# Patient Record
Sex: Male | Born: 1945 | Race: Black or African American | Hispanic: No | Marital: Married | Smoking: Never smoker
Health system: Southern US, Community
[De-identification: ages and names within clinical notes are randomized; demographics above are authoritative.]

## PROBLEM LIST (undated history)

## (undated) DIAGNOSIS — G2582 Stiff-man syndrome: Secondary | ICD-10-CM

## (undated) DIAGNOSIS — Z86718 Personal history of other venous thrombosis and embolism: Secondary | ICD-10-CM

## (undated) DIAGNOSIS — I10 Essential (primary) hypertension: Secondary | ICD-10-CM

## (undated) DIAGNOSIS — C61 Malignant neoplasm of prostate: Secondary | ICD-10-CM

## (undated) DIAGNOSIS — D869 Sarcoidosis, unspecified: Secondary | ICD-10-CM

## (undated) DIAGNOSIS — J45909 Unspecified asthma, uncomplicated: Secondary | ICD-10-CM

## (undated) DIAGNOSIS — N189 Chronic kidney disease, unspecified: Secondary | ICD-10-CM

## (undated) HISTORY — PX: OTHER SURGICAL HISTORY: SHX169

## (undated) HISTORY — PX: TUMOR REMOVAL: SHX12

## (undated) HISTORY — PX: SHOULDER SURGERY: SHX246

## (undated) HISTORY — PX: ROBOT ASSISTED LAPAROSCOPIC RADICAL PROSTATECTOMY: SHX5141

## (undated) HISTORY — PX: CARDIAC SURGERY: SHX584

---

## 2011-11-10 DIAGNOSIS — R7303 Prediabetes: Secondary | ICD-10-CM | POA: Insufficient documentation

## 2016-10-19 DIAGNOSIS — Z86711 Personal history of pulmonary embolism: Secondary | ICD-10-CM | POA: Insufficient documentation

## 2017-09-21 DIAGNOSIS — Z86718 Personal history of other venous thrombosis and embolism: Secondary | ICD-10-CM | POA: Insufficient documentation

## 2018-02-09 DIAGNOSIS — I890 Lymphedema, not elsewhere classified: Secondary | ICD-10-CM

## 2018-02-25 DIAGNOSIS — C61 Malignant neoplasm of prostate: Secondary | ICD-10-CM | POA: Insufficient documentation

## 2019-04-07 DIAGNOSIS — I10 Essential (primary) hypertension: Secondary | ICD-10-CM | POA: Diagnosis present

## 2019-04-19 DIAGNOSIS — C4921 Malignant neoplasm of connective and soft tissue of right lower limb, including hip: Secondary | ICD-10-CM | POA: Insufficient documentation

## 2019-04-19 DIAGNOSIS — D6859 Other primary thrombophilia: Secondary | ICD-10-CM | POA: Insufficient documentation

## 2019-04-19 DIAGNOSIS — R252 Cramp and spasm: Secondary | ICD-10-CM | POA: Insufficient documentation

## 2019-04-19 DIAGNOSIS — I82502 Chronic embolism and thrombosis of unspecified deep veins of left lower extremity: Secondary | ICD-10-CM | POA: Insufficient documentation

## 2019-04-19 DIAGNOSIS — M79661 Pain in right lower leg: Secondary | ICD-10-CM | POA: Insufficient documentation

## 2019-04-19 DIAGNOSIS — I87309 Chronic venous hypertension (idiopathic) without complications of unspecified lower extremity: Secondary | ICD-10-CM | POA: Insufficient documentation

## 2019-04-19 DIAGNOSIS — I639 Cerebral infarction, unspecified: Secondary | ICD-10-CM | POA: Insufficient documentation

## 2019-04-19 DIAGNOSIS — D869 Sarcoidosis, unspecified: Secondary | ICD-10-CM | POA: Insufficient documentation

## 2019-04-28 DIAGNOSIS — I340 Nonrheumatic mitral (valve) insufficiency: Secondary | ICD-10-CM | POA: Insufficient documentation

## 2019-04-28 DIAGNOSIS — I351 Nonrheumatic aortic (valve) insufficiency: Secondary | ICD-10-CM | POA: Insufficient documentation

## 2020-06-05 DIAGNOSIS — Z8616 Personal history of COVID-19: Secondary | ICD-10-CM | POA: Insufficient documentation

## 2020-06-05 DIAGNOSIS — N049 Nephrotic syndrome with unspecified morphologic changes: Secondary | ICD-10-CM | POA: Insufficient documentation

## 2020-06-05 DIAGNOSIS — I129 Hypertensive chronic kidney disease with stage 1 through stage 4 chronic kidney disease, or unspecified chronic kidney disease: Secondary | ICD-10-CM | POA: Insufficient documentation

## 2020-06-05 DIAGNOSIS — C419 Malignant neoplasm of bone and articular cartilage, unspecified: Secondary | ICD-10-CM | POA: Insufficient documentation

## 2020-06-05 DIAGNOSIS — R809 Proteinuria, unspecified: Secondary | ICD-10-CM | POA: Insufficient documentation

## 2021-02-25 DIAGNOSIS — N401 Enlarged prostate with lower urinary tract symptoms: Secondary | ICD-10-CM | POA: Insufficient documentation

## 2021-02-25 DIAGNOSIS — N529 Male erectile dysfunction, unspecified: Secondary | ICD-10-CM | POA: Insufficient documentation

## 2021-05-21 DIAGNOSIS — N184 Chronic kidney disease, stage 4 (severe): Secondary | ICD-10-CM | POA: Insufficient documentation

## 2021-05-21 DIAGNOSIS — G2582 Stiff-man syndrome: Secondary | ICD-10-CM

## 2021-06-08 DIAGNOSIS — I6930 Unspecified sequelae of cerebral infarction: Secondary | ICD-10-CM | POA: Insufficient documentation

## 2021-06-08 DIAGNOSIS — G4733 Obstructive sleep apnea (adult) (pediatric): Secondary | ICD-10-CM | POA: Insufficient documentation

## 2021-06-08 DIAGNOSIS — C4921 Malignant neoplasm of connective and soft tissue of right lower limb, including hip: Secondary | ICD-10-CM | POA: Insufficient documentation

## 2021-06-08 DIAGNOSIS — Z7901 Long term (current) use of anticoagulants: Secondary | ICD-10-CM

## 2021-06-08 DIAGNOSIS — J45909 Unspecified asthma, uncomplicated: Secondary | ICD-10-CM | POA: Insufficient documentation

## 2021-06-08 DIAGNOSIS — Z95828 Presence of other vascular implants and grafts: Secondary | ICD-10-CM | POA: Insufficient documentation

## 2021-06-08 DIAGNOSIS — I480 Paroxysmal atrial fibrillation: Secondary | ICD-10-CM | POA: Diagnosis present

## 2021-06-18 DIAGNOSIS — M12811 Other specific arthropathies, not elsewhere classified, right shoulder: Secondary | ICD-10-CM | POA: Insufficient documentation

## 2021-07-21 ENCOUNTER — Emergency Department (HOSPITAL_COMMUNITY): Payer: Medicare Other

## 2021-07-21 ENCOUNTER — Encounter (HOSPITAL_COMMUNITY): Payer: Self-pay | Admitting: Emergency Medicine

## 2021-07-21 ENCOUNTER — Inpatient Hospital Stay (HOSPITAL_COMMUNITY)
Admission: EM | Admit: 2021-07-21 | Discharge: 2021-07-25 | DRG: 683 | Disposition: A | Discharge status: Home / Self Care | Payer: Medicare Other | Attending: Family Medicine | Admitting: Family Medicine

## 2021-07-21 ENCOUNTER — Other Ambulatory Visit: Payer: Self-pay

## 2021-07-21 DIAGNOSIS — Z23 Encounter for immunization: Secondary | ICD-10-CM

## 2021-07-21 DIAGNOSIS — G2582 Stiff-man syndrome: Secondary | ICD-10-CM

## 2021-07-21 DIAGNOSIS — Z888 Allergy status to other drugs, medicaments and biological substances status: Secondary | ICD-10-CM

## 2021-07-21 DIAGNOSIS — I129 Hypertensive chronic kidney disease with stage 1 through stage 4 chronic kidney disease, or unspecified chronic kidney disease: Secondary | ICD-10-CM | POA: Diagnosis present

## 2021-07-21 DIAGNOSIS — K5903 Drug induced constipation: Secondary | ICD-10-CM | POA: Diagnosis present

## 2021-07-21 DIAGNOSIS — I890 Lymphedema, not elsewhere classified: Secondary | ICD-10-CM

## 2021-07-21 DIAGNOSIS — M5136 Other intervertebral disc degeneration, lumbar region: Secondary | ICD-10-CM | POA: Diagnosis present

## 2021-07-21 DIAGNOSIS — Z7902 Long term (current) use of antithrombotics/antiplatelets: Secondary | ICD-10-CM

## 2021-07-21 DIAGNOSIS — D6859 Other primary thrombophilia: Secondary | ICD-10-CM | POA: Diagnosis present

## 2021-07-21 DIAGNOSIS — Z8546 Personal history of malignant neoplasm of prostate: Secondary | ICD-10-CM

## 2021-07-21 DIAGNOSIS — R296 Repeated falls: Secondary | ICD-10-CM | POA: Diagnosis present

## 2021-07-21 DIAGNOSIS — K648 Other hemorrhoids: Secondary | ICD-10-CM | POA: Diagnosis present

## 2021-07-21 DIAGNOSIS — Z9079 Acquired absence of other genital organ(s): Secondary | ICD-10-CM

## 2021-07-21 DIAGNOSIS — I10 Essential (primary) hypertension: Secondary | ICD-10-CM | POA: Diagnosis present

## 2021-07-21 DIAGNOSIS — Z7901 Long term (current) use of anticoagulants: Secondary | ICD-10-CM

## 2021-07-21 DIAGNOSIS — M12811 Other specific arthropathies, not elsewhere classified, right shoulder: Secondary | ICD-10-CM

## 2021-07-21 DIAGNOSIS — R262 Difficulty in walking, not elsewhere classified: Secondary | ICD-10-CM

## 2021-07-21 DIAGNOSIS — Y92009 Unspecified place in unspecified non-institutional (private) residence as the place of occurrence of the external cause: Secondary | ICD-10-CM | POA: Diagnosis not present

## 2021-07-21 DIAGNOSIS — R7989 Other specified abnormal findings of blood chemistry: Secondary | ICD-10-CM | POA: Diagnosis not present

## 2021-07-21 DIAGNOSIS — Z86718 Personal history of other venous thrombosis and embolism: Secondary | ICD-10-CM

## 2021-07-21 DIAGNOSIS — N3941 Urge incontinence: Secondary | ICD-10-CM | POA: Diagnosis present

## 2021-07-21 DIAGNOSIS — D649 Anemia, unspecified: Secondary | ICD-10-CM | POA: Diagnosis present

## 2021-07-21 DIAGNOSIS — Z85830 Personal history of malignant neoplasm of bone: Secondary | ICD-10-CM | POA: Diagnosis not present

## 2021-07-21 DIAGNOSIS — Z20822 Contact with and (suspected) exposure to covid-19: Secondary | ICD-10-CM | POA: Diagnosis present

## 2021-07-21 DIAGNOSIS — W010XXA Fall on same level from slipping, tripping and stumbling without subsequent striking against object, initial encounter: Secondary | ICD-10-CM | POA: Diagnosis present

## 2021-07-21 DIAGNOSIS — Z79899 Other long term (current) drug therapy: Secondary | ICD-10-CM | POA: Diagnosis not present

## 2021-07-21 DIAGNOSIS — Z86711 Personal history of pulmonary embolism: Secondary | ICD-10-CM | POA: Diagnosis not present

## 2021-07-21 DIAGNOSIS — I480 Paroxysmal atrial fibrillation: Secondary | ICD-10-CM | POA: Diagnosis present

## 2021-07-21 DIAGNOSIS — N184 Chronic kidney disease, stage 4 (severe): Secondary | ICD-10-CM

## 2021-07-21 DIAGNOSIS — E875 Hyperkalemia: Secondary | ICD-10-CM

## 2021-07-21 DIAGNOSIS — T40605A Adverse effect of unspecified narcotics, initial encounter: Secondary | ICD-10-CM | POA: Diagnosis present

## 2021-07-21 DIAGNOSIS — R531 Weakness: Secondary | ICD-10-CM

## 2021-07-21 DIAGNOSIS — N179 Acute kidney failure, unspecified: Principal | ICD-10-CM | POA: Diagnosis present

## 2021-07-21 DIAGNOSIS — K5289 Other specified noninfective gastroenteritis and colitis: Secondary | ICD-10-CM | POA: Diagnosis present

## 2021-07-21 DIAGNOSIS — K625 Hemorrhage of anus and rectum: Secondary | ICD-10-CM | POA: Diagnosis not present

## 2021-07-21 HISTORY — DX: Essential (primary) hypertension: I10

## 2021-07-21 HISTORY — DX: Malignant neoplasm of prostate: C61

## 2021-07-21 HISTORY — DX: Personal history of other venous thrombosis and embolism: Z86.718

## 2021-07-21 HISTORY — DX: Sarcoidosis, unspecified: D86.9

## 2021-07-21 HISTORY — DX: Chronic kidney disease, unspecified: N18.9

## 2021-07-21 HISTORY — DX: Stiff-man syndrome: G25.82

## 2021-07-21 HISTORY — DX: Unspecified asthma, uncomplicated: J45.909

## 2021-07-21 LAB — CBC WITH DIFFERENTIAL/PLATELET
Abs Immature Granulocytes: 0.02 10*3/uL (ref 0.00–0.07)
Basophils Absolute: 0 10*3/uL (ref 0.0–0.1)
Basophils Relative: 0 %
Eosinophils Absolute: 0.2 10*3/uL (ref 0.0–0.5)
Eosinophils Relative: 3 %
HCT: 32.1 % — ABNORMAL LOW (ref 39.0–52.0)
Hemoglobin: 10 g/dL — ABNORMAL LOW (ref 13.0–17.0)
Immature Granulocytes: 0 %
Lymphocytes Relative: 13 %
Lymphs Abs: 0.9 10*3/uL (ref 0.7–4.0)
MCH: 30.3 pg (ref 26.0–34.0)
MCHC: 31.2 g/dL (ref 30.0–36.0)
MCV: 97.3 fL (ref 80.0–100.0)
Monocytes Absolute: 0.6 10*3/uL (ref 0.1–1.0)
Monocytes Relative: 9 %
Neutro Abs: 5.2 10*3/uL (ref 1.7–7.7)
Neutrophils Relative %: 75 %
Platelets: 151 10*3/uL (ref 150–400)
RBC: 3.3 MIL/uL — ABNORMAL LOW (ref 4.22–5.81)
RDW: 14.9 % (ref 11.5–15.5)
WBC: 6.9 10*3/uL (ref 4.0–10.5)
nRBC: 0 % (ref 0.0–0.2)

## 2021-07-21 LAB — URINALYSIS, ROUTINE W REFLEX MICROSCOPIC
Bilirubin Urine: NEGATIVE
Glucose, UA: NEGATIVE mg/dL
Ketones, ur: NEGATIVE mg/dL
Leukocytes,Ua: NEGATIVE
Nitrite: NEGATIVE
Protein, ur: 100 mg/dL — AB
Specific Gravity, Urine: 1.014 (ref 1.005–1.030)
pH: 5 (ref 5.0–8.0)

## 2021-07-21 LAB — CBG MONITORING, ED: Glucose-Capillary: 88 mg/dL (ref 70–99)

## 2021-07-21 LAB — COMPREHENSIVE METABOLIC PANEL
ALT: 34 U/L (ref 0–44)
AST: 77 U/L — ABNORMAL HIGH (ref 15–41)
Albumin: 2.9 g/dL — ABNORMAL LOW (ref 3.5–5.0)
Alkaline Phosphatase: 180 U/L — ABNORMAL HIGH (ref 38–126)
Anion gap: 6 (ref 5–15)
BUN: 63 mg/dL — ABNORMAL HIGH (ref 8–23)
CO2: 21 mmol/L — ABNORMAL LOW (ref 22–32)
Calcium: 9.3 mg/dL (ref 8.9–10.3)
Chloride: 109 mmol/L (ref 98–111)
Creatinine, Ser: 4.78 mg/dL — ABNORMAL HIGH (ref 0.61–1.24)
GFR, Estimated: 12 mL/min — ABNORMAL LOW (ref >60)
Glucose, Bld: 103 mg/dL — ABNORMAL HIGH (ref 70–99)
Potassium: 5.4 mmol/L — ABNORMAL HIGH (ref 3.5–5.1)
Sodium: 136 mmol/L (ref 135–145)
Total Bilirubin: 0.8 mg/dL (ref 0.3–1.2)
Total Protein: 7.3 g/dL (ref 6.5–8.1)

## 2021-07-21 LAB — CK: Total CK: 745 U/L — ABNORMAL HIGH (ref 49–397)

## 2021-07-21 MED ORDER — OXYCODONE HCL 5 MG PO TABS: 5.0000 mg | ORAL_TABLET | ORAL | Status: DC | PRN

## 2021-07-21 MED ORDER — ACETAMINOPHEN 650 MG RE SUPP: 650.0000 mg | Freq: Four times a day (QID) | RECTAL | Status: DC | PRN

## 2021-07-21 MED ORDER — MIRABEGRON ER 25 MG PO TB24
50.0000 mg | ORAL_TABLET | Freq: Every day | ORAL | Status: DC
  Administered 2021-07-22 – 2021-07-25 (×3): 50 mg via ORAL
  Filled 2021-07-21 (×3): qty 2

## 2021-07-21 MED ORDER — ONDANSETRON HCL 4 MG PO TABS: 4.0000 mg | ORAL_TABLET | Freq: Four times a day (QID) | ORAL | Status: DC | PRN

## 2021-07-21 MED ORDER — ONDANSETRON HCL 4 MG/2ML IJ SOLN: 4.0000 mg | Freq: Four times a day (QID) | INTRAMUSCULAR | Status: DC | PRN

## 2021-07-21 MED ORDER — MELATONIN 5 MG PO TABS
10.0000 mg | ORAL_TABLET | Freq: Every evening | ORAL | Status: DC | PRN
  Filled 2021-07-21: qty 2

## 2021-07-21 MED ORDER — HEPARIN (PORCINE) 25000 UT/250ML-% IV SOLN
1050.0000 [IU]/h | INTRAVENOUS | Status: DC
  Administered 2021-07-21: 1050 [IU]/h via INTRAVENOUS
  Filled 2021-07-21: qty 250

## 2021-07-21 MED ORDER — MELATONIN 3 MG PO TABS: 10.0000 mg | ORAL_TABLET | Freq: Every evening | ORAL | Status: DC | PRN

## 2021-07-21 MED ORDER — SODIUM CHLORIDE 0.9 % IV BOLUS
500.0000 mL | Freq: Once | INTRAVENOUS | Status: AC
  Administered 2021-07-21: 500 mL via INTRAVENOUS

## 2021-07-21 MED ORDER — SODIUM CHLORIDE 0.9 % IV SOLN: INTRAVENOUS | Status: DC

## 2021-07-21 MED ORDER — AMLODIPINE BESYLATE 5 MG PO TABS
5.0000 mg | ORAL_TABLET | Freq: Every day | ORAL | Status: DC
  Administered 2021-07-22 – 2021-07-25 (×3): 5 mg via ORAL
  Filled 2021-07-21 (×3): qty 1

## 2021-07-21 MED ORDER — ACETAMINOPHEN 325 MG PO TABS: 650.0000 mg | ORAL_TABLET | Freq: Four times a day (QID) | ORAL | Status: DC | PRN

## 2021-07-21 MED ORDER — SODIUM ZIRCONIUM CYCLOSILICATE 5 G PO PACK
10.0000 g | PACK | Freq: Once | ORAL | Status: AC
  Administered 2021-07-21: 10 g via ORAL
  Filled 2021-07-21: qty 2

## 2021-07-21 NOTE — Subjective & Objective (Signed)
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  Patient had right rotator cuff surgery performed at [facility] hospital 2 weeks ago.  Patient was discharged on Lyrica, Zanaflex and oxycodone.  Since that time, the patient has been having increasing ability was stumbling and falling.  Patient fell 3 days ago while walking up the front of his stairs to his home.  Likely his wife was behind him and he fell on top of her.  Patient has known CKD stage IV with a baseline creatinine approximately 2.8-3.2.  He has been followed extensively at [facility].  Patient recently moved to [facility] from [place].  He is in the ER with his wife [name].  [name] states that she has been having increasing difficulty getting the patient up and about.  He was normally walking and driving without any assistive devices.  Over the last 3 days, since his fall, she has had to help him get out of bed.  She even had a call EMS when he fell in the house as she was unable to pick them up.  On arrival to the ER today, blood pressure 101/62, heart rate 73, temperature 98.1.  Labs show white count 6.9, hemoglobin 10.0, sodium 136, potassium 5.4, BUN of 63, creatinine 4.78, CO2 of 21, glucose 103.  Urinalysis specific gravity 1.014  CT head without acute intracranial abnormality.  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  Patient states that he has been drinking fluids well over the last week.  Wife states he will drink for 5 bottles of water along with a bottle of soda and it may be the bottle of  Gatorade as well.  Appetites been normal.  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  Due to the patient's acute renal failure and inability to walk, [facility] hospital[facility] contacted for admission.

## 2021-07-21 NOTE — ED Notes (Signed)
Bladder scan read 104 ML

## 2021-07-21 NOTE — ED Triage Notes (Addendum)
Pt arrives to ED d/t weakness post shoulder surgery 9/29. Pt concerned d/t having trouble getting around. Pts wife states periods of AMS, pt A&Ox4 during triage.

## 2021-07-21 NOTE — H&P (Signed)
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  Patient had right rotator cuff surgery performed at [facility] hospital 2 weeks ago.  Patient was discharged on Lyrica, Zanaflex and oxycodone.  Since that time, the patient has been having increasing ability was stumbling and falling.  Patient fell 3 days ago while walking up the front of his stairs to his home.  Likely his wife was behind him and he fell on top of her.  Patient has known CKD stage IV with a baseline creatinine approximately 2.8-3.2.  He has been followed extensively at [facility].  Patient recently moved to [facility] from [place].  He is in the ER with his wife Carlos Rodriguez.  Carlos Rodriguez states that she has been having increasing difficulty getting the patient up and about.  He was normally walking and driving without any assistive devices.  Over the last 3 days, since his fall, she has had to help him get out of bed.  She even had a call EMS when he fell in the house as she was unable to pick them up.  On arrival to the ER today, blood pressure 101/62, heart rate 73, temperature 98.1.  Labs show white count 6.9, hemoglobin 10.0, sodium 136, potassium 5.4, BUN of 63, creatinine 4.78, CO2 of 21, glucose 103.  Urinalysis specific gravity 1.014  CT head without acute intracranial abnormality.  Lumbar spine series demonstrated advanced degenerative disc disease L3-4, L4-5,  L5-S1  Right hip x-ray negative for fracture dislocation.  Patient states that he has been drinking fluids well over the last week.  Wife states he will drink for 5 bottles of water along with a bottle of soda and it may be the bottle of Gatorade as well.  Appetites been normal.  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  Due to the patient's acute renal failure and inability to walk, [facility] contacted for admission.   ED Course: pt noted to have AKI on CKD stage 4. Started on IVF.  Review of Systems:  Review of Systems  Constitutional:  Positive for malaise/fatigue.  HENT: Negative.    Eyes: Negative.   Respiratory: Negative.    Cardiovascular: Negative.   Gastrointestinal: Negative.   Genitourinary:        Chronic urinary incontinence  Musculoskeletal:        Difficulty walking  Skin: Negative.   Neurological:  Positive for dizziness and weakness.  Endo/Heme/Allergies: Negative.   Psychiatric/Behavioral: Negative.    All other systems reviewed and are negative.  Past Medical History:  Diagnosis Date   Asthma     Past Surgical History:  Procedure Laterality Date   CARDIAC SURGERY     SHOULDER SURGERY     Right shoulder   TUMOR REMOVAL       reports that he has never smoked. He has never used smokeless tobacco. He reports current alcohol use. He reports that he does not currently use drugs.  Allergies  Allergen Reactions   Dilaudid [Hydromorphone] Shortness  Of Breath    History reviewed. No pertinent family history.  Prior to Admission medications   Medication Sig Start Date End Date Taking? Authorizing Provider  acetaminophen (TYLENOL) 325 MG tablet Take by mouth.   Yes [provider]  enoxaparin (LOVENOX) 100 MG/ML injection Inject into the skin. 05/22/21  Yes [provider]  enoxaparin (LOVENOX) 60 MG/0.6ML injection Inject 60 mg into the skin daily. 04/18/19  Yes [provider]  losartan (COZAAR) 100 MG  tablet Take 100 mg by mouth every morning. 04/02/21  Yes [provider]  MYRBETRIQ 50 MG TB24 tablet Take 50 mg by mouth daily. 07/16/21  Yes [provider]  oxyCODONE (OXY IR/ROXICODONE) 5 MG immediate release tablet Take by mouth. 07/11/21  Yes [provider]  pregabalin (LYRICA) 75 MG capsule Take 75 mg by mouth 2 (two) times daily. 07/11/21 07/25/21 Yes [provider]  solifenacin (VESICARE) 10 MG tablet Take 1 tablet by mouth daily. 06/27/21  Yes [provider]  tiZANidine (ZANAFLEX) 2 MG tablet Take 2 mg by mouth 2 (two) times daily. 07/11/21  Yes [provider]  Ascorbic Acid (VITAMIN C) 1000 MG tablet Take by mouth. Patient not taking: Reported on 07/21/2021    [provider]  augmented betamethasone dipropionate (DIPROLENE-AF) 0.05 % ointment Apply topically. Patient not taking: Reported on 07/21/2021 02/03/21   [provider]  citalopram (CELEXA) 20 MG tablet Take 20 mg by mouth daily. Patient not taking: Reported on 07/21/2021 04/12/21   [provider]  diazepam (VALIUM) 5 MG tablet Take 2.5mg  nightly for 1 week; increase to 2.5mg  twice a day for next week; increase to 2.5mg  three times a day for week three; then continue at 2.5mg  four times a day afterwards Patient not taking: Reported on 07/21/2021 05/30/21   [provider]  Fluticasone Propionate, Inhal, (FLOVENT DISKUS) 100 MCG/BLIST AEPB Inhale into the lungs. Patient not taking: Reported on 07/21/2021    [provider]  furosemide (LASIX) 20 MG tablet Take by mouth. Patient not taking: Reported on 07/21/2021 02/16/20   [provider]  HYDROcodone-acetaminophen (NORCO/VICODIN) 5-325 MG tablet Take 1 tablet by mouth every 4 (four) hours as needed. Patient not taking: Reported on 07/21/2021 04/11/21   [provider]  omeprazole (PRILOSEC) 20 MG capsule Take 20 mg by mouth 2 (two) times daily. Patient not taking:  Reported on 07/21/2021 01/28/21   [provider]    Physical Exam: Vitals:   07/21/21 1730 07/21/21 1800 07/21/21 1830 07/21/21 1900  BP: 133/85 120/80 131/80 129/67  Pulse: (!) 59 (!) 58 (!) 58 62  Resp: 15 20 13 16   Temp:      TempSrc:      SpO2: 100% 100% 100% 100%  Weight:      Height:        Physical Exam Vitals and nursing note reviewed.  Constitutional:      General: He is not in acute distress.    Appearance: He is not ill-appearing, toxic-appearing or diaphoretic.  HENT:     Head: Normocephalic and atraumatic.     Nose: Nose normal. No rhinorrhea.  Eyes:     General:        Right eye: No discharge.        Left eye: No discharge.  Cardiovascular:     Rate and Rhythm: Normal rate and regular rhythm.     Pulses: Normal pulses.  Pulmonary:     Effort: Pulmonary effort is normal. No  respiratory distress.     Breath sounds: No wheezing or rales.  Abdominal:     General: Abdomen is flat. Bowel sounds are normal. There is no distension.     Tenderness: There is no abdominal tenderness. There is no guarding or rebound.  Musculoskeletal:     Comments: Right LE lymphedema  Skin:    General: Skin is warm and dry.     Capillary Refill: Capillary refill takes less than 2 seconds.  Neurological:     General: No focal deficit present.     Mental Status: He is alert and oriented to person, place, and time.     Labs on Admission: I have personally reviewed following labs and imaging studies  CBC: Recent Labs  Lab 07/21/21 1505  WBC 6.9  NEUTROABS 5.2  HGB 10.0*  HCT 32.1*  MCV 97.3  PLT 616   Basic Metabolic Panel: Recent Labs  Lab 07/21/21 1505  NA 136  K 5.4*  CL 109  CO2 21*  GLUCOSE 103*  BUN 63*  CREATININE 4.78*  CALCIUM 9.3   GFR: Estimated Creatinine Clearance: 15.2 mL/min (A) (by C-G formula based on SCr of 4.78 mg/dL (H)). Liver Function Tests: Recent Labs  Lab 07/21/21 1505  AST 77*  ALT 34  ALKPHOS 180*  BILITOT 0.8  PROT  7.3  ALBUMIN 2.9*   No results for input(s): LIPASE, AMYLASE in the last 168 hours. No results for input(s): AMMONIA in the last 168 hours. Coagulation Profile: No results for input(s): INR, PROTIME in the last 168 hours. Cardiac Enzymes: No results for input(s): CKTOTAL, CKMB, CKMBINDEX, TROPONINI in the last 168 hours. BNP (last 3 results) No results for input(s): PROBNP in the last 8760 hours. HbA1C: No results for input(s): HGBA1C in the last 72 hours. CBG: Recent Labs  Lab 07/21/21 1507  GLUCAP 88   Lipid Profile: No results for input(s): CHOL, HDL, LDLCALC, TRIG, CHOLHDL, LDLDIRECT in the last 72 hours. Thyroid Function Tests: No results for input(s): TSH, T4TOTAL, FREET4, T3FREE, THYROIDAB in the last 72 hours. Anemia Panel: No results for input(s): VITAMINB12, FOLATE, FERRITIN, TIBC, IRON, RETICCTPCT in the last 72 hours. Urine analysis:    Component Value Date/Time   COLORURINE YELLOW 07/21/2021 1529   APPEARANCEUR CLOUDY (A) 07/21/2021 1529   LABSPEC 1.014 07/21/2021 1529   PHURINE 5.0 07/21/2021 1529   GLUCOSEU NEGATIVE 07/21/2021 1529   HGBUR MODERATE (A) 07/21/2021 1529   BILIRUBINUR NEGATIVE 07/21/2021 1529   [place] 07/21/2021 1529   PROTEINUR 100 (A) 07/21/2021 1529   NITRITE NEGATIVE 07/21/2021 1529   LEUKOCYTESUR NEGATIVE 07/21/2021 1529    Radiological Exams on Admission: I have personally reviewed images DG Lumbar Spine Complete  Result Date: 07/21/2021 CLINICAL DATA:  Status post fall. EXAM: LUMBAR SPINE - COMPLETE 4+ VIEW COMPARISON:  June 18, 2021 FINDINGS: There is no evidence of lumbar spine fracture. Alignment is normal. Marked severity endplate sclerosis is seen at the levels of L3-L4, L4-L5 and L5-S1. Moderate to marked severity intervertebral disc space narrowing is also seen at these levels. Radiopaque surgical clips are seen within the right upper quadrant. Inferior vena cava filter is noted. IMPRESSION: 1. No acute findings  in the lumbar spine. 2. 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 Electronically Signed   By: Virgina Norfolk M.D.   On: 07/21/2021 16:19   CT Head Wo Contrast  Result Date: 07/21/2021 CLINICAL DATA:  Mental status change. EXAM: CT HEAD WITHOUT CONTRAST TECHNIQUE: Contiguous axial images  were obtained from the base of the skull through the vertex without intravenous contrast. COMPARISON:  None. BRAIN: BRAIN Cerebral ventricle sizes are concordant with the degree of cerebral volume loss. Cerebral ventricle sizes are concordant with the degree of cerebral volume loss. Patchy and confluent areas of decreased attenuation are noted throughout the deep and periventricular white matter of the cerebral hemispheres bilaterally, compatible with chronic microvascular ischemic disease. No evidence of large-territorial acute infarction. No parenchymal hemorrhage. No mass lesion. No extra-axial collection. No mass effect or midline shift. No hydrocephalus. Basilar cisterns are patent. Vascular: No hyperdense vessel. Atherosclerotic calcifications are present within the cavernous internal carotid arteries. Skull: No acute fracture or focal lesion. Sinuses/Orbits: Paranasal sinuses and mastoid air cells are clear. Bilateral lens replacement. Orbits are unremarkable. Other: None. IMPRESSION: No acute intracranial abnormality. Electronically Signed   By: Iven Finn M.D.   On: 07/21/2021 16:31   DG Hip Unilat W or Wo Pelvis 2-3 Views Left  Result Date: 07/21/2021 CLINICAL DATA:  Status post fall 2 days ago. EXAM: DG HIP (WITH OR WITHOUT PELVIS) 2-3V LEFT COMPARISON:  June 18, 2021 FINDINGS: There is no evidence of hip fracture or dislocation. There is no evidence of arthropathy or other focal bone abnormality. IMPRESSION: Negative. Electronically Signed   By: Virgina Norfolk M.D.   On: 07/21/2021 16:18    EKG: I have personally reviewed EKG: no EKG  Assessment/Plan Principal Problem:    Acute renal failure superimposed on stage 4 chronic kidney disease (HCC) Active Problems:   Inability to walk - likely due to Lyrica   Essential hypertension   Hyperkalemia   Chronic anticoagulation - on lifelong lovenox 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 while on coumadin and Xarelto   Lymphedema of right lower extremity   Paroxysmal atrial fibrillation (HCC)   Stiff person syndrome    Acute renal failure superimposed on stage 4 chronic kidney disease ([place]) Admit to [facility]. Continue with gentle IVF. Check renal U/S. May benefit from nephrology consultation. Pt has not seen nephrology yet since moving to [place] earlier this summer. Hold nephrotoxic agents. Hold losartan.  Post void residual was 104 mL.  Unlikely obstructive uropathy causing his acute renal failure.  Most likely his renal failure is due to continued use of losartan, addition of Lyrica.  Patient without any proteinuria on UA.  Patient does have hemoglobin noted on UA but 0-5 RBCs.  Will check CK to rule out rhabdomyolysis as a cause of his acute renal failure.  Inability to walk - likely due to Lyrica Pt was walking without assistive devices and driving care prior to right rotator cuff surgery.  I think that Lyrica will take some time to clear from his system given his AKI and CKD stage 4.  Pt may need inpatient rehab evaluation. Wife states she cannot care for patient at his current level of disability.  Essential hypertension Will need to hold losartan due to AKI. Change to norvasc.  Chronic anticoagulation - on lifelong lovenox 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 while on coumadin and Xarelto Given his AKI, will ask pharamcy what the best systemic anticoagulation strategy should be.  Lymphedema of right lower extremity chronic  Paroxysmal atrial fibrillation (HCC) stable  Stiff person syndrome Chronic.  Hyperkalemia Continue with IVF hydration. One dose of lokelma. Repeat labs in AM.  DVT prophylaxis: Lovenox Code Status: Full  Code Family Communication: discussed with pt and wife Carlos Rodriguez at bedside  Disposition Plan: inpatient rehab vs SNF vs HHPT  Consults called: none  Admission  status: Inpatient, Med-Surg @ [place], DO [facility] 07/21/2021, 7:52 PM

## 2021-07-21 NOTE — Assessment & Plan Note (Signed)
chronic

## 2021-07-21 NOTE — ED Notes (Signed)
Pt transported to Radiology 

## 2021-07-21 NOTE — Assessment & Plan Note (Signed)
stable °

## 2021-07-21 NOTE — Assessment & Plan Note (Signed)
Given his AKI, will ask pharamcy what the best systemic anticoagulation strategy should be.

## 2021-07-21 NOTE — Assessment & Plan Note (Signed)
Pt was walking without assistive devices and driving care prior to right rotator cuff surgery.  I think that Lyrica will take some time to clear from his system given his AKI and CKD stage 4.  Pt may need inpatient rehab evaluation. Wife states she cannot care for patient at his current level of disability.

## 2021-07-21 NOTE — Assessment & Plan Note (Addendum)
Admit to [facility]. Continue with gentle IVF. Check renal U/S. May benefit from nephrology consultation. Pt has not seen nephrology yet since moving to [place] earlier this summer. Hold nephrotoxic agents. Hold losartan.  Post void residual was 104 mL.  Unlikely obstructive uropathy causing his acute renal failure.  Most likely his renal failure is due to continued use of losartan, addition of Lyrica.  Patient without any proteinuria on UA.  Patient does have hemoglobin noted on UA but 0-5 RBCs.  Will check CK to rule out rhabdomyolysis as a cause of his acute renal failure.

## 2021-07-21 NOTE — ED Provider Notes (Signed)
[facility] EMERGENCY DEPARTMENT Provider Note   CSN: [id number] Arrival date & time: 07/21/21  1147     History Chief Complaint  Patient presents with   Weakness    Carlos Rodriguez is a 75 y.o. 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  Wife at the bedside states that he has been having increasing difficulty with ambulation x4 days, describing an episode where he actually fell down the steps landing on her as she was coming up behind him as he fell backwards, this fall occurred 4 days ago.  He is now unable to ambulate without assistance.  She also describes that he has episodes of confusion, asking questions and making comments that are not appropriate to the conversation.  He denies headache or head injury with his fall.  He does have some pain in his left hip and lower back region since this fall.  He does have 2 new medications with this surgery including oxycodone and Lyrica.  No fevers or chills, denies dysuria, nausea, vomiting, chest pain or abdominal pain.  Wife at bedside gives majority of patient's history.  She is desirous of rehab placement as she cannot care for him in his current state of health.  The history is provided by the patient and the spouse.      Past Medical History:  Diagnosis Date   Asthma     There are no problems to display for this patient.   Past Surgical History:  Procedure Laterality Date   CARDIAC SURGERY     SHOULDER SURGERY     Right shoulder   TUMOR REMOVAL         History reviewed. No pertinent family history.  Social History   Tobacco Use   Smoking status: Never   Smokeless tobacco: Never   Substance Use Topics   Alcohol use: Yes    Comment: occasionaly   Drug use: Not Currently    Home Medications Prior to Admission medications   Medication Sig Start Date End Date Taking? Authorizing Provider  diazepam (VALIUM) 5 MG tablet Take 2.5mg  nightly for 1 week; increase to 2.5mg  twice a day for next week; increase to 2.5mg  three times a day for week three; then continue at 2.5mg  four times a day afterwards 05/30/21  Yes [provider]  enoxaparin (LOVENOX) 100 MG/ML injection Inject into the skin. 05/22/21  Yes [provider]  furosemide (LASIX) 20 MG tablet Take by mouth. 02/16/20  Yes [provider]  pregabalin (LYRICA) 75 MG capsule Take by mouth. 07/11/21 07/25/21 Yes [provider]  solifenacin (VESICARE) 10 MG tablet Take 1 tablet by mouth daily. 06/27/21  Yes [provider]  acetaminophen (TYLENOL) 325 MG tablet Take by mouth.    [provider]  Ascorbic Acid (VITAMIN C) 1000 MG tablet Take by mouth.    [provider]  augmented betamethasone dipropionate (DIPROLENE-AF) 0.05 % ointment Apply topically. 02/03/21   [provider]  citalopram (CELEXA) 20 MG tablet Take 20 mg by mouth daily. 04/12/21   [provider]  Fluticasone Propionate, Inhal, (FLOVENT DISKUS) 100 MCG/BLIST AEPB Inhale into the lungs.    [provider]  HYDROcodone-acetaminophen (NORCO/VICODIN) 5-325 MG tablet Take 1 tablet  by mouth every 4 (four) hours as needed. 04/11/21   [provider]  losartan (COZAAR) 100 MG tablet Take 100 mg by mouth every morning. 04/02/21   [provider]  MYRBETRIQ 50 MG TB24 tablet Take 50 mg by mouth daily. 07/16/21   [provider]  omeprazole (PRILOSEC) 20 MG capsule Take 20 mg by mouth 2 (two) times daily. 01/28/21   [provider]  oxyCODONE (OXY IR/ROXICODONE) 5 MG immediate release tablet Take by mouth. 07/11/21   [provider]  tiZANidine  (ZANAFLEX) 2 MG tablet Take 2 mg by mouth 3 (three) times daily. 07/11/21   [provider]    Allergies    Patient has no allergy information on record.  Review of Systems   Review of Systems  Constitutional:  Negative for chills and fever.  HENT: Negative.    Eyes: Negative.   Respiratory:  Negative for chest tightness and shortness of breath.   Cardiovascular:  Negative for chest pain.  Gastrointestinal:  Negative for abdominal pain and nausea.  Genitourinary: Negative.   Musculoskeletal:  Positive for arthralgias. Negative for joint swelling and neck pain.  Skin: Negative.  Negative for rash and wound.  Neurological:  Positive for dizziness and weakness. Negative for light-headedness, numbness and headaches.  Psychiatric/Behavioral:  Positive for confusion.   All other systems reviewed and are negative.  Physical Exam Updated Vital Signs BP 120/80   Pulse (!) 58   Temp 98.1 F (36.7 C) (Oral)   Resp 20   Ht 5\' 8"  (1.727 m)   Wt 93 kg   SpO2 100%   BMI 31.17 kg/m   Physical Exam Vitals and nursing note reviewed.  Constitutional:      Appearance: Normal appearance. He is well-developed.  HENT:     Head: Normocephalic and atraumatic.  Eyes:     Conjunctiva/sclera: Conjunctivae normal.  Cardiovascular:     Rate and Rhythm: Normal rate and regular rhythm.     Heart sounds: Normal heart sounds.  Pulmonary:     Effort: Pulmonary effort is normal.     Breath sounds: Normal breath sounds. No wheezing.  Abdominal:     General: Bowel sounds are normal.     Palpations: Abdomen is soft.     Tenderness: There is no abdominal tenderness.  Musculoskeletal:     Cervical back: Normal range of motion.     Comments: Right arm in sling.  Dressing in place, appears clean.   Skin:    General: Skin is warm and dry.  Neurological:     General: No focal deficit present.     Mental Status: He is alert and oriented to person, place, and time.     Cranial Nerves: No cranial  nerve deficit.     Sensory: Sensation is intact.     Motor: Motor function is intact.    ED Results / Procedures / Treatments   Labs (all labs ordered are listed, but only abnormal results are displayed) Labs Reviewed  CBC WITH DIFFERENTIAL/PLATELET - Abnormal; Notable for the following components:      Result Value   RBC 3.30 (*)    Hemoglobin 10.0 (*)    HCT 32.1 (*)    All other components within normal limits  URINALYSIS, ROUTINE W REFLEX MICROSCOPIC - Abnormal; Notable for the following components:   APPearance CLOUDY (*)    Hgb urine dipstick MODERATE (*)    Protein, ur 100 (*)    Bacteria, UA RARE (*)  All other components within normal limits  COMPREHENSIVE METABOLIC PANEL - Abnormal; Notable for the following components:   Potassium 5.4 (*)    CO2 21 (*)    Glucose, Bld 103 (*)    BUN 63 (*)    Creatinine, Ser 4.78 (*)    Albumin 2.9 (*)    AST 77 (*)    Alkaline Phosphatase 180 (*)    GFR, Estimated 12 (*)    All other components within normal limits  CBG MONITORING, ED    EKG None  Radiology DG Lumbar Spine Complete  Result Date: 07/21/2021 CLINICAL DATA:  Status post fall. EXAM: LUMBAR SPINE - COMPLETE 4+ VIEW COMPARISON:  June 18, 2021 FINDINGS: There is no evidence of lumbar spine fracture. Alignment is normal. Marked severity endplate sclerosis is seen at the levels of L3-L4, L4-L5 and L5-S1. Moderate to marked severity intervertebral disc space narrowing is also seen at these levels. Radiopaque surgical clips are seen within the right upper quadrant. Inferior vena cava filter is noted. IMPRESSION: 1. No acute findings in the lumbar spine. 2. 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 Electronically Signed   By: Virgina Norfolk M.D.   On: 07/21/2021 16:19   CT Head Wo Contrast  Result Date: 07/21/2021 CLINICAL DATA:  Mental status change. EXAM: CT HEAD WITHOUT CONTRAST TECHNIQUE: Contiguous axial images were obtained from the  base of the skull through the vertex without intravenous contrast. COMPARISON:  None. BRAIN: BRAIN Cerebral ventricle sizes are concordant with the degree of cerebral volume loss. Cerebral ventricle sizes are concordant with the degree of cerebral volume loss. Patchy and confluent areas of decreased attenuation are noted throughout the deep and periventricular white matter of the cerebral hemispheres bilaterally, compatible with chronic microvascular ischemic disease. No evidence of large-territorial acute infarction. No parenchymal hemorrhage. No mass lesion. No extra-axial collection. No mass effect or midline shift. No hydrocephalus. Basilar cisterns are patent. Vascular: No hyperdense vessel. Atherosclerotic calcifications are present within the cavernous internal carotid arteries. Skull: No acute fracture or focal lesion. Sinuses/Orbits: Paranasal sinuses and mastoid air cells are clear. Bilateral lens replacement. Orbits are unremarkable. Other: None. IMPRESSION: No acute intracranial abnormality. Electronically Signed   By: Iven Finn M.D.   On: 07/21/2021 16:31   DG Hip Unilat W or Wo Pelvis 2-3 Views Left  Result Date: 07/21/2021 CLINICAL DATA:  Status post fall 2 days ago. EXAM: DG HIP (WITH OR WITHOUT PELVIS) 2-3V LEFT COMPARISON:  June 18, 2021 FINDINGS: There is no evidence of hip fracture or dislocation. There is no evidence of arthropathy or other focal bone abnormality. IMPRESSION: Negative. Electronically Signed   By: Virgina Norfolk M.D.   On: 07/21/2021 16:18    Procedures Procedures   Medications Ordered in ED Medications  sodium chloride 0.9 % bolus 500 mL (0 mLs Intravenous Stopped 07/21/21 1758)  sodium chloride 0.9 % bolus 500 mL (500 mLs Intravenous New Bag/Given 07/21/21 1759)    ED Course  I have reviewed the triage vital signs and the nursing notes.  Pertinent labs & imaging results that were available during my care of the patient were reviewed by me and  considered in my medical decision making (see chart for details).    MDM Rules/Calculators/A&P                           Patient with increasing confusion and difficulty ambulation status post right shoulder surgery.  He does  have significant lab abnormalities including an elevated potassium level at 5.4 along with a BUN of 63 and a creatinine of 4.78 with acute renal failure along with dehydration.  He does have a history of renal insufficiency with his last creatinine at 2.83 on August 19.  Ct head negative for acute bleed or subacute cva.  No focal exam findings to suggest cva.    Discussed with Dr. Bridgett Larsson who asked for post void residual, 104 mL- he has accepted pt for admission.  Final Clinical Impression(s) / ED Diagnoses Final diagnoses:  Acute renal failure, unspecified acute renal failure type ([place])  Hyperkalemia  Weakness    Rx / DC Orders ED Discharge Orders     None        Landis Martins 07/21/21 1849    Hayden Rasmussen, MD 07/21/21 2144

## 2021-07-21 NOTE — Assessment & Plan Note (Signed)
Chronic. 

## 2021-07-21 NOTE — Assessment & Plan Note (Signed)
Will need to hold losartan due to AKI. Change to norvasc.

## 2021-07-21 NOTE — Assessment & Plan Note (Signed)
Continue with IVF hydration. One dose of lokelma. Repeat labs in AM.

## 2021-07-21 NOTE — Progress Notes (Signed)
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 SCr 4.78 on presentation (baseline ~2.8-3.2). Pharmacy consulted to transition from PTA Lovenox to heparin infusion for now with AKI. Patient reported last dose 10/10 PTA per med rec, but wife/patient told MD Bridgett Larsson) it has been >24hrs since last Lovenox dose. Last fill documented is 30-day supply on 05/23/21.  Per discussion with Dr. Bridgett Larsson, start heparin drip with no initial bolus in case actually received Lovenox today and target lower goal. Hg 10, plt wnl. No active bleed issues reported.  Goal of Therapy:  Heparin level 0.3-0.5 units/ml Monitor platelets by anticoagulation protocol: Yes   Plan:  Hold PTA Lovenox No bolus. Start heparin drip at 1050 units/hr Check 8hr heparin level Monitor daily CBC, s/sx bleeding F/u Nephrology recommendations and plans for long-term anticoagulation   Arturo Morton, PharmD, BCPS Clinical Pharmacist 07/21/2021 8:37 PM

## 2021-07-21 NOTE — ED Notes (Signed)
Pt difficult IV stick. SWOT nurse called to come assist

## 2021-07-22 ENCOUNTER — Inpatient Hospital Stay (HOSPITAL_COMMUNITY): Payer: Medicare Other

## 2021-07-22 ENCOUNTER — Encounter (HOSPITAL_COMMUNITY): Payer: Self-pay | Admitting: Family Medicine

## 2021-07-22 DIAGNOSIS — Z7901 Long term (current) use of anticoagulants: Secondary | ICD-10-CM | POA: Diagnosis not present

## 2021-07-22 DIAGNOSIS — K625 Hemorrhage of anus and rectum: Secondary | ICD-10-CM | POA: Diagnosis not present

## 2021-07-22 DIAGNOSIS — E875 Hyperkalemia: Secondary | ICD-10-CM | POA: Diagnosis not present

## 2021-07-22 DIAGNOSIS — N179 Acute kidney failure, unspecified: Secondary | ICD-10-CM | POA: Diagnosis not present

## 2021-07-22 DIAGNOSIS — I10 Essential (primary) hypertension: Secondary | ICD-10-CM | POA: Diagnosis not present

## 2021-07-22 LAB — CBC WITH DIFFERENTIAL/PLATELET
Abs Immature Granulocytes: 0.02 10*3/uL (ref 0.00–0.07)
Basophils Absolute: 0 10*3/uL (ref 0.0–0.1)
Basophils Relative: 0 %
Eosinophils Absolute: 0.2 10*3/uL (ref 0.0–0.5)
Eosinophils Relative: 4 %
HCT: 29 % — ABNORMAL LOW (ref 39.0–52.0)
Hemoglobin: 9.2 g/dL — ABNORMAL LOW (ref 13.0–17.0)
Immature Granulocytes: 0 %
Lymphocytes Relative: 18 %
Lymphs Abs: 1.1 10*3/uL (ref 0.7–4.0)
MCH: 30.1 pg (ref 26.0–34.0)
MCHC: 31.7 g/dL (ref 30.0–36.0)
MCV: 94.8 fL (ref 80.0–100.0)
Monocytes Absolute: 0.6 10*3/uL (ref 0.1–1.0)
Monocytes Relative: 9 %
Neutro Abs: 4.1 10*3/uL (ref 1.7–7.7)
Neutrophils Relative %: 69 %
Platelets: 131 10*3/uL — ABNORMAL LOW (ref 150–400)
RBC: 3.06 MIL/uL — ABNORMAL LOW (ref 4.22–5.81)
RDW: 14.7 % (ref 11.5–15.5)
WBC: 6 10*3/uL (ref 4.0–10.5)
nRBC: 0 % (ref 0.0–0.2)

## 2021-07-22 LAB — COMPREHENSIVE METABOLIC PANEL
ALT: 27 U/L (ref 0–44)
AST: 51 U/L — ABNORMAL HIGH (ref 15–41)
Albumin: 2.6 g/dL — ABNORMAL LOW (ref 3.5–5.0)
Alkaline Phosphatase: 156 U/L — ABNORMAL HIGH (ref 38–126)
Anion gap: 6 (ref 5–15)
BUN: 60 mg/dL — ABNORMAL HIGH (ref 8–23)
CO2: 17 mmol/L — ABNORMAL LOW (ref 22–32)
Calcium: 8.9 mg/dL (ref 8.9–10.3)
Chloride: 113 mmol/L — ABNORMAL HIGH (ref 98–111)
Creatinine, Ser: 4.21 mg/dL — ABNORMAL HIGH (ref 0.61–1.24)
GFR, Estimated: 14 mL/min — ABNORMAL LOW (ref >60)
Glucose, Bld: 107 mg/dL — ABNORMAL HIGH (ref 70–99)
Potassium: 5.6 mmol/L — ABNORMAL HIGH (ref 3.5–5.1)
Sodium: 136 mmol/L (ref 135–145)
Total Bilirubin: 0.7 mg/dL (ref 0.3–1.2)
Total Protein: 6.2 g/dL — ABNORMAL LOW (ref 6.5–8.1)

## 2021-07-22 LAB — HEMOGLOBIN AND HEMATOCRIT, BLOOD
HCT: 33.2 % — ABNORMAL LOW (ref 39.0–52.0)
Hemoglobin: 10.2 g/dL — ABNORMAL LOW (ref 13.0–17.0)

## 2021-07-22 LAB — HEPARIN LEVEL (UNFRACTIONATED)
Heparin Unfractionated: 0.38 IU/mL (ref 0.30–0.70)
Heparin Unfractionated: <0.1 IU/mL — ABNORMAL LOW (ref 0.30–0.70)

## 2021-07-22 LAB — RESP PANEL BY RT-PCR (FLU A&B, COVID) ARPGX2
Influenza A by PCR: NEGATIVE
Influenza B by PCR: NEGATIVE
SARS Coronavirus 2 by RT PCR: NEGATIVE

## 2021-07-22 LAB — MAGNESIUM: Magnesium: 2.1 mg/dL (ref 1.7–2.4)

## 2021-07-22 MED ORDER — MELATONIN 3 MG PO TABS: 9.0000 mg | ORAL_TABLET | Freq: Every evening | ORAL | Status: DC | PRN

## 2021-07-22 MED ORDER — HEPARIN (PORCINE) 25000 UT/250ML-% IV SOLN
1050.0000 [IU]/h | INTRAVENOUS | Status: DC
  Administered 2021-07-22: 1050 [IU]/h via INTRAVENOUS
  Filled 2021-07-22: qty 250

## 2021-07-22 MED ORDER — PNEUMOCOCCAL VAC POLYVALENT 25 MCG/0.5ML IJ INJ
0.5000 mL | INJECTION | INTRAMUSCULAR | Status: AC
  Administered 2021-07-23: 0.5 mL via INTRAMUSCULAR
  Filled 2021-07-22: qty 0.5

## 2021-07-22 MED ORDER — SODIUM ZIRCONIUM CYCLOSILICATE 5 G PO PACK
10.0000 g | PACK | Freq: Once | ORAL | Status: AC
  Administered 2021-07-22: 10 g via ORAL
  Filled 2021-07-22: qty 2

## 2021-07-22 MED ORDER — SODIUM CHLORIDE 0.9 % IV SOLN: INTRAVENOUS | Status: DC

## 2021-07-22 MED ORDER — PANTOPRAZOLE SODIUM 40 MG IV SOLR
40.0000 mg | Freq: Two times a day (BID) | INTRAVENOUS | Status: DC
  Administered 2021-07-22 – 2021-07-24 (×5): 40 mg via INTRAVENOUS
  Filled 2021-07-22 (×5): qty 40

## 2021-07-22 MED ORDER — INFLUENZA VAC A&B SA ADJ QUAD 0.5 ML IM PRSY
0.5000 mL | PREFILLED_SYRINGE | INTRAMUSCULAR | Status: AC
  Administered 2021-07-23: 0.5 mL via INTRAMUSCULAR
  Filled 2021-07-22: qty 0.5

## 2021-07-22 NOTE — H&P (View-Only) (Signed)
Referring Provider: Dr. Roxan Hockey Primary Care Physician:  System, Provider Not In Primary Gastroenterologist:  Dr. Jenetta Downer, previously unassigned  Date of Admission: 07/21/21 Date of Consultation: 07/22/21  Reason for Consultation:  Rectal Bleeding  HPI:  Carlos Rodriguez is a 75 y.o. 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 Presenting this admission with falls, worsening ambulation, mental status changes with confusion. Admitted with acute on chronic renal injury, progressive weakness and confusion. GI consulted due to new onset rectal bleeding in setting of Heparin infusion.  Nursing staff state patient had a BM with bright red blood surrounding BM. Small clots were in bedside commode along with paper hematochezia with wiping.   Patient is alert to person and situation but not at baseline mentally. Wife at bedside. Patient reports having "boxes" in room last night that had to be climbed over. Wife states at baseline he is very articulate and this is not him. 1 of information obtained from wife.  Patient notes chronic history of low-volume hematochezia in setting of straining. No abdominal pain, N/V. No changes in appetite. No dysphagia. No unexplained weight loss. Last colonoscopy over 5 years ago in [place]. Unsure the findings. No prior EGD. 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   Admitting Hgb 10.0 and this morning 9.2. Hgb 11.8 in June 2022. Elevated alk phos historically and fluctuating transaminases. No recent abdominal imaging.     Past Medical History:  Diagnosis Date   Asthma     Past Surgical History:  Procedure Laterality Date   CARDIAC SURGERY     SHOULDER SURGERY     Right shoulder   TUMOR REMOVAL      Prior to Admission medications   Medication Sig Start Date End Date Taking? Authorizing Provider  acetaminophen (TYLENOL) 325 MG tablet Take by mouth.   Yes [provider]  enoxaparin (LOVENOX) 100 MG/ML injection Inject into the skin. 05/22/21  Yes [provider]  enoxaparin (LOVENOX) 60 MG/0.6ML injection Inject 60 mg into the skin daily. 04/18/19  Yes [provider]  losartan (COZAAR) 100 MG tablet Take 100 mg by mouth every morning. 04/02/21  Yes [provider]  MYRBETRIQ 50 MG TB24 tablet Take 50 mg by mouth daily. 07/16/21  Yes [provider]  oxyCODONE (OXY IR/ROXICODONE) 5 MG immediate release tablet Take by mouth. 07/11/21  Yes [provider]  pregabalin (LYRICA) 75 MG capsule Take 75 mg by mouth 2 (two) times daily. 07/11/21 07/25/21 Yes [provider]  solifenacin (VESICARE) 10 MG tablet Take 1 tablet by mouth daily. 06/27/21  Yes [provider]  tiZANidine (ZANAFLEX) 2 MG tablet Take 2 mg by mouth 2 (two) times daily. 07/11/21  Yes [provider]  Ascorbic Acid (VITAMIN C) 1000 MG tablet Take by mouth. Patient not taking: Reported on 07/21/2021    [provider]  augmented betamethasone dipropionate (DIPROLENE-AF) 0.05 % ointment Apply topically. Patient not taking: Reported on 07/21/2021 02/03/21   [provider]  citalopram (CELEXA) 20 MG tablet Take 20 mg by mouth daily. Patient not taking: Reported on 07/21/2021 04/12/21   [provider]  diazepam (VALIUM) 5 MG tablet Take 2.717m nightly for 1 week; increase to 2.543mtwice a day for next week; increase to 2.17m79mhree times a day for week three; then continue at 2.17mg106mur times a day afterwards Patient not taking: Reported on  07/21/2021 05/30/21   [provider]  Fluticasone Propionate, Inhal, (FLOVENT DISKUS) 100 MCG/BLIST AEPB Inhale into the lungs. Patient not taking: Reported on 07/21/2021    [provider]  furosemide (LASIX) 20 MG tablet Take by mouth. Patient not taking: Reported on 07/21/2021 02/16/20   [provider]  HYDROcodone-acetaminophen (NORCO/VICODIN) 5-325 MG tablet Take 1 tablet by mouth every 4 (four) hours as needed. Patient not taking: Reported on 07/21/2021 04/11/21   [provider]  omeprazole (PRILOSEC) 20 MG capsule Take 20 mg by mouth 2 (two) times daily. Patient not taking: Reported on 07/21/2021 01/28/21   [provider]    Current Facility-Administered Medications  Medication Dose Route Frequency Provider Last Rate Last Admin   0.9 %  sodium chloride infusion   Intravenous Continuous ChenKristopher Oppenheim 75 mL/hr at 07/22/21 0748 New Bag at 07/22/21 0748   acetaminophen (TYLENOL) tablet 650 mg  650 mg Oral Q6H PRN ChenKristopher Oppenheim       Or   acetaminophen (TYLENOL) suppository 650 mg  650 mg Rectal Q6H PRN ChenKristopher Oppenheim       amLODipine (NORVASC) tablet 5 mg  5 mg Oral Daily ChenKristopher Oppenheim       melatonin tablet 9 mg  9 mg Oral QHS PRN EmokRoxan Hockey       mirabegron ER (MYRBETRIQ) tablet 50 mg  50 mg Oral Daily ChenKristopher Oppenheim       ondansetron (ZOFPgc Endoscopy Center For Excellence LLCblet 4 mg  4 mg Oral Q6H PRN ChenKristopher Oppenheim       Or   ondansetron (ZOFBucktail Medical Centerjection 4 mg  4 mg Intravenous Q6H PRN ChenKristopher Oppenheim       oxyCODONE (Oxy IR/ROXICODONE) immediate release tablet 5 mg  5 mg Oral Q4H PRN ChenKristopher Oppenheim       pantoprazole (PROTONIX) injection 40 mg  40 mg Intravenous Q12H EmokRoxan Hockey       Current Outpatient Medications  Medication Sig Dispense Refill   acetaminophen (TYLENOL) 325 MG tablet Take by mouth.     enoxaparin (LOVENOX) 100 MG/ML injection Inject into the skin.     enoxaparin (LOVENOX) 60 MG/0.6ML injection Inject 60 mg into the skin  daily.     losartan (COZAAR) 100 MG tablet Take 100 mg by mouth every morning.     MYRBETRIQ 50 MG TB24 tablet Take 50 mg by mouth daily.     oxyCODONE (OXY IR/ROXICODONE) 5 MG immediate release tablet Take by mouth.     pregabalin (LYRICA) 75 MG capsule Take 75 mg by mouth 2 (two) times daily.     solifenacin (VESICARE) 10 MG tablet Take 1 tablet by mouth daily.     tiZANidine (ZANAFLEX) 2 MG tablet Take 2 mg by mouth 2 (two) times daily.     Ascorbic Acid (VITAMIN C) 1000 MG tablet Take by mouth. (Patient not taking: Reported on 07/21/2021)     augmented betamethasone dipropionate (DIPROLENE-AF) 0.05 % ointment Apply topically. (Patient not taking: Reported on 07/21/2021)     citalopram (CELEXA) 20 MG tablet Take 20 mg by mouth daily. (Patient not taking: Reported on 07/21/2021)     diazepam (VALIUM) 5 MG tablet Take 2.17mg 59mhtly  for 1 week; increase to 2.67m twice a day for next week; increase to 2.597mthree times a day for week three; then continue at 2.9m53mour times a day afterwards (Patient not taking: Reported on 07/21/2021)     Fluticasone Propionate, Inhal, (FLOVENT DISKUS) 100 MCG/BLIST AEPB Inhale into the lungs. (Patient not taking: Reported on 07/21/2021)     furosemide (LASIX) 20 MG tablet Take by mouth. (Patient not taking: Reported on 07/21/2021)     HYDROcodone-acetaminophen (NORCO/VICODIN) 5-325 MG tablet Take 1 tablet by mouth every 4 (four) hours as needed. (Patient not taking: Reported on 07/21/2021)     omeprazole (PRILOSEC) 20 MG capsule Take 20 mg by mouth 2 (two) times daily. (Patient not taking: Reported on 07/21/2021)      Allergies as of 07/21/2021 - Review Complete 07/21/2021  Allergen Reaction Noted   Dilaudid [hydromorphone] Shortness Of Breath 07/21/2021    History reviewed. No pertinent family history.  Social History   Socioeconomic History   Marital status: Married    Spouse name: Not on file   Number of children: Not on file   Years of education:  Not on file   Highest education level: Not on file  Occupational History   Not on file  Tobacco Use   Smoking status: Never   Smokeless tobacco: Never  Substance and Sexual Activity   Alcohol use: Yes    Comment: occasionaly   Drug use: Not Currently   Sexual activity: Yes  Other Topics Concern   Not on file  Social History Narrative   Not on file   Social Determinants of Health   Financial Resource Strain: Not on file  Food Insecurity: Not on file  Transportation Needs: Not on file  Physical Activity: Not on file  Stress: Not on file  Social Connections: Not on file  Intimate Partner Violence: Not on file    Review of Systems: Gen: Denies fever, chills, loss of appetite, change in weight or weight loss CV: Denies chest pain, heart palpitations, syncope, edema  Resp: Denies shortness of breath with rest, cough, wheezing GI: see HPI GU : Denies urinary burning, urinary frequency, urinary incontinence.  MS: Denies joint pain,swelling, cramping Derm: Denies rash, itching, dry skin Psych: see HPI Heme: Denies bruising, bleeding, and enlarged lymph nodes.  Physical Exam: Vital signs in last 24 hours: Temp:  [98.1 F (36.7 C)] 98.1 F (36.7 C) (10/10 1159) Pulse Rate:  [55-73] 58 (10/11 0900) Resp:  [10-22] 10 (10/11 0900) BP: (101-138)/(61-113) 130/70 (10/11 0900) SpO2:  [96 %-100 %] 98 % (10/11 0900) FiO2 (%):  [21 %] 21 % (10/10 2041) Weight:  [93 kg] 93 kg (10/10 1202)   General:   Alert,  Well-developed, well-nourished, pleasant and cooperative in NAD Head:  Normocephalic and atraumatic. Eyes:  Sclera clear, no icterus.   Conjunctiva pink. Ears:  Normal auditory acuity. Lungs:  Clear throughout to auscultation.    Heart:  S1 S2 present  Abdomen:  Soft, nontender and nondistended. No masses, hepatosplenomegaly or hernias noted. Normal bowel sounds, without guarding, and without rebound.   Rectal:  Deferred until time of colonoscopy.   Extremities:  With marked  RLE lymphedema Neurologic:  Alert and  oriented to person and place. Still with mild confusion Psych:  Alert and cooperative. Normal mood and affect.  Intake/Output from previous day: 10/10 0701 - 10/11 0700 In: 532.2 [I.V.:32.2; IV Piggyback:500] Out: -  Intake/Output this shift: No intake/output data recorded.  Lab Results: Recent Labs  07/21/21 1505 07/22/21 0646  WBC 6.9 6.0  HGB 10.0* 9.2*  HCT 32.1* 29.0*  PLT 151 131*   BMET Recent Labs    07/21/21 1505 07/22/21 0646  NA 136 136  K 5.4* 5.6*  CL 109 113*  CO2 21* 17*  GLUCOSE 103* 107*  BUN 63* 60*  CREATININE 4.78* 4.21*  CALCIUM 9.3 8.9   LFT Recent Labs    07/21/21 1505 07/22/21 0646  PROT 7.3 6.2*  ALBUMIN 2.9* 2.6*  AST 77* 51*  ALT 34 27  ALKPHOS 180* 156*  BILITOT 0.8 0.7    Studies/Results: DG Lumbar Spine Complete  Result Date: 07/21/2021 CLINICAL DATA:  Status post fall. EXAM: LUMBAR SPINE - COMPLETE 4+ VIEW COMPARISON:  June 18, 2021 FINDINGS: There is no evidence of lumbar spine fracture. Alignment is normal. Marked severity endplate sclerosis is seen at the levels of L3-L4, L4-L5 and L5-S1. Moderate to marked severity intervertebral disc space narrowing is also seen at these levels. Radiopaque surgical clips are seen within the right upper quadrant. Inferior vena cava filter is noted. IMPRESSION: 1. No acute findings in the lumbar spine. 2. 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 Electronically Signed   By: Virgina Norfolk M.D.   On: 07/21/2021 16:19   CT Head Wo Contrast  Result Date: 07/21/2021 CLINICAL DATA:  Mental status change. EXAM: CT HEAD WITHOUT CONTRAST TECHNIQUE: Contiguous axial images were obtained from the base of the skull through the vertex without intravenous contrast. COMPARISON:  None. BRAIN: BRAIN Cerebral ventricle sizes are concordant with the degree of cerebral volume loss. Cerebral ventricle sizes are concordant with the degree of  cerebral volume loss. Patchy and confluent areas of decreased attenuation are noted throughout the deep and periventricular white matter of the cerebral hemispheres bilaterally, compatible with chronic microvascular ischemic disease. No evidence of large-territorial acute infarction. No parenchymal hemorrhage. No mass lesion. No extra-axial collection. No mass effect or midline shift. No hydrocephalus. Basilar cisterns are patent. Vascular: No hyperdense vessel. Atherosclerotic calcifications are present within the cavernous internal carotid arteries. Skull: No acute fracture or focal lesion. Sinuses/Orbits: Paranasal sinuses and mastoid air cells are clear. Bilateral lens replacement. Orbits are unremarkable. Other: None. IMPRESSION: No acute intracranial abnormality. Electronically Signed   By: Iven Finn M.D.   On: 07/21/2021 16:31   US RENAL  Result Date: 07/22/2021 CLINICAL DATA:  AKI EXAM: RENAL / URINARY TRACT ULTRASOUND COMPLETE COMPARISON:  None. FINDINGS: Evaluation of the kidneys is limited due to significant overlying bowel gas and patient immobility due to recent surgery. Right Kidney: The right lower pole is not well evaluated. Approximate renal measurements: 6.4 cm x 4.7 cm x 4.8 cm = volume: 76 cc mL. Echogenicity within normal limits. No definite mass or hydronephrosis visualized. Left Kidney: Approximate renal measurements: 8.5 cm x 5.0 cm x 5.6 cm = volume: 123 mL. Echogenicity within normal limits. No mass or hydronephrosis visualized. Bladder: Appears normal for degree of bladder distention. Both ureteral jets were seen. Other: None. IMPRESSION: 1. Evaluation of the kidneys is difficult due to significant overlying bowel gas and patient immobility. Within these confines, no abnormality was identified. No hydronephrosis. 2. Normal bladder with both ureteral jets identified. Electronically Signed   By: Valetta Mole M.D.   On: 07/22/2021 08:50   DG Hip Unilat W or Wo Pelvis 2-3 Views  Left  Result Date: 07/21/2021 CLINICAL DATA:  Status post fall 2 days ago. EXAM: DG HIP (WITH OR WITHOUT PELVIS) 2-3V  LEFT COMPARISON:  June 18, 2021 FINDINGS: There is no evidence of hip fracture or dislocation. There is no evidence of arthropathy or other focal bone abnormality. IMPRESSION: Negative. Electronically Signed   By: Virgina Norfolk M.D.   On: 07/21/2021 16:18    Impression:  75 y.o. 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 Presenting this admission with falls, worsening ambulation, mental status changes with confusion. Admitted with acute on chronic renal injury, progressive weakness and confusion. Felt that Lyrica may be playing a role. GI consulted due to new onset rectal bleeding in setting of Heparin infusion.  Lower GI bleeding reported earlier this morning surrounding stool, small clots, and paper hematochezia. Will recheck H/H now. Last colonoscopy over 5 years ago in [place]; patient is unsure if polyps present. Query diverticular in setting of anticoagulation, unable to rule out occult malignancy. Patient will need ongoing anticoagulation, so colonoscopy is recommended to evaluate further. Does not appear to be rapid transit UGI bleeding. Notably, he has had chronic low-volume hematochezia in the past in setting of straining.   Anemia: Hgb 10/11 range over past few months. Admitting Hgb 10.0 and this morning 9.2. Will recheck H/H now. Heparin infusion on hold.   Hypercoagulable state: with DVT/PE in past. Has failed Xarelto and Coumadin as he develops VTEs while on this. Heparin on hold for  now.   Elevated LFTs: patient denies any history of this. Upon review of chart, he has had elevated alk phos and fluctuating transaminases in the past. Acute hepatitis panel negative in July 2020 at outside facility. Recommend rechecking HFP in am. Could consider [place]. No recent imaging. Holding off on extensive serologies at this point but will need follow-up.   Plan: Full liquids Recheck H/H Heparin infusion on hold HFP in am Colonoscopy +/- EGD  likely on 10/12 to further evaluate rectal bleeding   Annitta Needs, PhD, ANP-BC [facility]     LOS: 1 day    07/22/2021, 10:49 AM

## 2021-07-22 NOTE — ED Notes (Signed)
Pts bed was soiled, along with brief. Bloody stools in bedside chair. Urine was sitting in the floor. Floor and linen and pt was cleaned up with a purewick placed on pt.  Pt states he can stand and walk. His balance is okay standing with help beside the bed.

## 2021-07-22 NOTE — Consult Note (Signed)
Referring Provider: Dr. Courage Emokpae Primary Care Physician:  System, Provider Not In Primary Gastroenterologist:  Dr. Castaneda, previously unassigned  Date of Admission: 07/21/21 Date of Consultation: 07/22/21  Reason for Consultation:  Rectal Bleeding  HPI:  Carlos Rodriguez is a 75 y.o. 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 Presenting this admission with falls, worsening ambulation, mental status changes with confusion. Admitted with acute on chronic renal injury, progressive weakness and confusion. GI consulted due to new onset rectal bleeding in setting of Heparin infusion.  Nursing staff state patient had a BM with bright red blood surrounding BM. Small clots were in bedside commode along with paper hematochezia with wiping.   Patient is alert to person and situation but not at baseline mentally. Wife at bedside. Patient reports having "boxes" in room last night that had to be climbed over. Wife states at baseline he is very articulate and this is not him. Majority of information obtained from wife.  Patient notes chronic history of low-volume hematochezia in setting of straining. No abdominal pain, N/V. No changes in appetite. No dysphagia. No unexplained weight loss. Last colonoscopy over 5 years ago in [place]. Unsure the findings. No prior EGD. 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   Admitting Hgb 10.0 and this morning 9.2. Hgb 11.8 in June 2022. Elevated alk phos historically and fluctuating transaminases. No recent abdominal imaging.     Past Medical History:  Diagnosis Date   Asthma     Past Surgical History:  Procedure Laterality Date   CARDIAC SURGERY     SHOULDER SURGERY     Right shoulder   TUMOR REMOVAL      Prior to Admission medications   Medication Sig Start Date End Date Taking? Authorizing Provider  acetaminophen (TYLENOL) 325 MG tablet Take by mouth.   Yes [provider]  enoxaparin (LOVENOX) 100 MG/ML injection Inject into the skin. 05/22/21  Yes [provider]  enoxaparin (LOVENOX) 60 MG/0.6ML injection Inject 60 mg into the skin daily. 04/18/19  Yes [provider]  losartan (COZAAR) 100 MG tablet Take 100 mg by mouth every morning. 04/02/21  Yes [provider]  MYRBETRIQ 50 MG TB24 tablet Take 50 mg by mouth daily. 07/16/21  Yes [provider]  oxyCODONE (OXY IR/ROXICODONE) 5 MG immediate release tablet Take by mouth. 07/11/21  Yes [provider]  pregabalin (LYRICA) 75 MG capsule Take 75 mg by mouth 2 (two) times daily. 07/11/21 07/25/21 Yes [provider]  solifenacin (VESICARE) 10 MG tablet Take 1 tablet by mouth daily. 06/27/21  Yes [provider]  tiZANidine (ZANAFLEX) 2 MG tablet Take 2 mg by mouth 2 (two) times daily. 07/11/21  Yes [provider]  Ascorbic Acid (VITAMIN C) 1000 MG tablet Take by mouth. Patient not taking: Reported on 07/21/2021    [provider]  augmented betamethasone dipropionate (DIPROLENE-AF) 0.05 % ointment Apply topically. Patient not taking: Reported on 07/21/2021 02/03/21   [provider]  citalopram (CELEXA) 20 MG tablet Take 20 mg by mouth daily. Patient not taking: Reported on 07/21/2021 04/12/21   [provider]    diazepam (VALIUM) 5 MG tablet Take 2.5mg nightly for 1 week; increase to 2.5mg twice a day for next week; increase to 2.5mg three times a day for week three; then continue at 2.5mg four times a day afterwards Patient not taking: Reported on  07/21/2021 05/30/21   [provider]  Fluticasone Propionate, Inhal, (FLOVENT DISKUS) 100 MCG/BLIST AEPB Inhale into the lungs. Patient not taking: Reported on 07/21/2021    [provider]  furosemide (LASIX) 20 MG tablet Take by mouth. Patient not taking: Reported on 07/21/2021 02/16/20   [provider]  HYDROcodone-acetaminophen (NORCO/VICODIN) 5-325 MG tablet Take 1 tablet by mouth every 4 (four) hours as needed. Patient not taking: Reported on 07/21/2021 04/11/21   [provider]  omeprazole (PRILOSEC) 20 MG capsule Take 20 mg by mouth 2 (two) times daily. Patient not taking: Reported on 07/21/2021 01/28/21   [provider]    Current Facility-Administered Medications  Medication Dose Route Frequency Provider Last Rate Last Admin   0.9 %  sodium chloride infusion   Intravenous Continuous Chen, Eric, DO 75 mL/hr at 07/22/21 0748 New Bag at 07/22/21 0748   acetaminophen (TYLENOL) tablet 650 mg  650 mg Oral Q6H PRN Chen, Eric, DO       Or   acetaminophen (TYLENOL) suppository 650 mg  650 mg Rectal Q6H PRN Chen, Eric, DO       amLODipine (NORVASC) tablet 5 mg  5 mg Oral Daily Chen, Eric, DO       melatonin tablet 9 mg  9 mg Oral QHS PRN Emokpae, Courage, MD       mirabegron ER (MYRBETRIQ) tablet 50 mg  50 mg Oral Daily Chen, Eric, DO       ondansetron (ZOFRAN) tablet 4 mg  4 mg Oral Q6H PRN Chen, Eric, DO       Or   ondansetron (ZOFRAN) injection 4 mg  4 mg Intravenous Q6H PRN Chen, Eric, DO       oxyCODONE (Oxy IR/ROXICODONE) immediate release tablet 5 mg  5 mg Oral Q4H PRN Chen, Eric, DO       pantoprazole (PROTONIX) injection 40 mg  40 mg Intravenous Q12H Emokpae, Courage, MD       Current Outpatient Medications  Medication Sig Dispense Refill   acetaminophen (TYLENOL) 325 MG tablet Take by mouth.     enoxaparin (LOVENOX) 100 MG/ML injection Inject into the skin.     enoxaparin (LOVENOX) 60 MG/0.6ML injection Inject 60 mg into the skin  daily.     losartan (COZAAR) 100 MG tablet Take 100 mg by mouth every morning.     MYRBETRIQ 50 MG TB24 tablet Take 50 mg by mouth daily.     oxyCODONE (OXY IR/ROXICODONE) 5 MG immediate release tablet Take by mouth.     pregabalin (LYRICA) 75 MG capsule Take 75 mg by mouth 2 (two) times daily.     solifenacin (VESICARE) 10 MG tablet Take 1 tablet by mouth daily.     tiZANidine (ZANAFLEX) 2 MG tablet Take 2 mg by mouth 2 (two) times daily.     Ascorbic Acid (VITAMIN C) 1000 MG tablet Take by mouth. (Patient not taking: Reported on 07/21/2021)     augmented betamethasone dipropionate (DIPROLENE-AF) 0.05 % ointment Apply topically. (Patient not taking: Reported on 07/21/2021)     citalopram (CELEXA) 20 MG tablet Take 20 mg by mouth daily. (Patient not taking: Reported on 07/21/2021)     diazepam (VALIUM) 5 MG tablet Take 2.5mg nightly   for 1 week; increase to 2.67m twice a day for next week; increase to 2.597mthree times a day for week three; then continue at 2.9m53mour times a day afterwards (Patient not taking: Reported on 07/21/2021)     Fluticasone Propionate, Inhal, (FLOVENT DISKUS) 100 MCG/BLIST AEPB Inhale into the lungs. (Patient not taking: Reported on 07/21/2021)     furosemide (LASIX) 20 MG tablet Take by mouth. (Patient not taking: Reported on 07/21/2021)     HYDROcodone-acetaminophen (NORCO/VICODIN) 5-325 MG tablet Take 1 tablet by mouth every 4 (four) hours as needed. (Patient not taking: Reported on 07/21/2021)     omeprazole (PRILOSEC) 20 MG capsule Take 20 mg by mouth 2 (two) times daily. (Patient not taking: Reported on 07/21/2021)      Allergies as of 07/21/2021 - Review Complete 07/21/2021  Allergen Reaction Noted   Dilaudid [hydromorphone] Shortness Of Breath 07/21/2021    History reviewed. No pertinent family history.  Social History   Socioeconomic History   Marital status: Married    Spouse name: Not on file   Number of children: Not on file   Years of education:  Not on file   Highest education level: Not on file  Occupational History   Not on file  Tobacco Use   Smoking status: Never   Smokeless tobacco: Never  Substance and Sexual Activity   Alcohol use: Yes    Comment: occasionaly   Drug use: Not Currently   Sexual activity: Yes  Other Topics Concern   Not on file  Social History Narrative   Not on file   Social Determinants of Health   Financial Resource Strain: Not on file  Food Insecurity: Not on file  Transportation Needs: Not on file  Physical Activity: Not on file  Stress: Not on file  Social Connections: Not on file  Intimate Partner Violence: Not on file    Review of Systems: Gen: Denies fever, chills, loss of appetite, change in weight or weight loss CV: Denies chest pain, heart palpitations, syncope, edema  Resp: Denies shortness of breath with rest, cough, wheezing GI: see HPI GU : Denies urinary burning, urinary frequency, urinary incontinence.  MS: Denies joint pain,swelling, cramping Derm: Denies rash, itching, dry skin Psych: see HPI Heme: Denies bruising, bleeding, and enlarged lymph nodes.  Physical Exam: Vital signs in last 24 hours: Temp:  [98.1 F (36.7 C)] 98.1 F (36.7 C) (10/10 1159) Pulse Rate:  [55-73] 58 (10/11 0900) Resp:  [10-22] 10 (10/11 0900) BP: (101-138)/(61-113) 130/70 (10/11 0900) SpO2:  [96 %-100 %] 98 % (10/11 0900) FiO2 (%):  [21 %] 21 % (10/10 2041) Weight:  [93 kg] 93 kg (10/10 1202)   General:   Alert,  Well-developed, well-nourished, pleasant and cooperative in NAD Head:  Normocephalic and atraumatic. Eyes:  Sclera clear, no icterus.   Conjunctiva pink. Ears:  Normal auditory acuity. Lungs:  Clear throughout to auscultation.    Heart:  S1 S2 present  Abdomen:  Soft, nontender and nondistended. No masses, hepatosplenomegaly or hernias noted. Normal bowel sounds, without guarding, and without rebound.   Rectal:  Deferred until time of colonoscopy.   Extremities:  With marked  RLE lymphedema Neurologic:  Alert and  oriented to person and place. Still with mild confusion Psych:  Alert and cooperative. Normal mood and affect.  Intake/Output from previous day: 10/10 0701 - 10/11 0700 In: 532.2 [I.V.:32.2; IV Piggyback:500] Out: -  Intake/Output this shift: No intake/output data recorded.  Lab Results: Recent Labs  07/21/21 1505 07/22/21 0646  WBC 6.9 6.0  HGB 10.0* 9.2*  HCT 32.1* 29.0*  PLT 151 131*   BMET Recent Labs    07/21/21 1505 07/22/21 0646  NA 136 136  K 5.4* 5.6*  CL 109 113*  CO2 21* 17*  GLUCOSE 103* 107*  BUN 63* 60*  CREATININE 4.78* 4.21*  CALCIUM 9.3 8.9   LFT Recent Labs    07/21/21 1505 07/22/21 0646  PROT 7.3 6.2*  ALBUMIN 2.9* 2.6*  AST 77* 51*  ALT 34 27  ALKPHOS 180* 156*  BILITOT 0.8 0.7    Studies/Results: DG Lumbar Spine Complete  Result Date: 07/21/2021 CLINICAL DATA:  Status post fall. EXAM: LUMBAR SPINE - COMPLETE 4+ VIEW COMPARISON:  June 18, 2021 FINDINGS: There is no evidence of lumbar spine fracture. Alignment is normal. Marked severity endplate sclerosis is seen at the levels of L3-L4, L4-L5 and L5-S1. Moderate to marked severity intervertebral disc space narrowing is also seen at these levels. Radiopaque surgical clips are seen within the right upper quadrant. Inferior vena cava filter is noted. IMPRESSION: 1. No acute findings in the lumbar spine. 2. 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 Electronically Signed   By: Virgina Norfolk M.D.   On: 07/21/2021 16:19   CT Head Wo Contrast  Result Date: 07/21/2021 CLINICAL DATA:  Mental status change. EXAM: CT HEAD WITHOUT CONTRAST TECHNIQUE: Contiguous axial images were obtained from the base of the skull through the vertex without intravenous contrast. COMPARISON:  None. BRAIN: BRAIN Cerebral ventricle sizes are concordant with the degree of cerebral volume loss. Cerebral ventricle sizes are concordant with the degree of  cerebral volume loss. Patchy and confluent areas of decreased attenuation are noted throughout the deep and periventricular white matter of the cerebral hemispheres bilaterally, compatible with chronic microvascular ischemic disease. No evidence of large-territorial acute infarction. No parenchymal hemorrhage. No mass lesion. No extra-axial collection. No mass effect or midline shift. No hydrocephalus. Basilar cisterns are patent. Vascular: No hyperdense vessel. Atherosclerotic calcifications are present within the cavernous internal carotid arteries. Skull: No acute fracture or focal lesion. Sinuses/Orbits: Paranasal sinuses and mastoid air cells are clear. Bilateral lens replacement. Orbits are unremarkable. Other: None. IMPRESSION: No acute intracranial abnormality. Electronically Signed   By: Iven Finn M.D.   On: 07/21/2021 16:31   US RENAL  Result Date: 07/22/2021 CLINICAL DATA:  AKI EXAM: RENAL / URINARY TRACT ULTRASOUND COMPLETE COMPARISON:  None. FINDINGS: Evaluation of the kidneys is limited due to significant overlying bowel gas and patient immobility due to recent surgery. Right Kidney: The right lower pole is not well evaluated. Approximate renal measurements: 6.4 cm x 4.7 cm x 4.8 cm = volume: 76 cc mL. Echogenicity within normal limits. No definite mass or hydronephrosis visualized. Left Kidney: Approximate renal measurements: 8.5 cm x 5.0 cm x 5.6 cm = volume: 123 mL. Echogenicity within normal limits. No mass or hydronephrosis visualized. Bladder: Appears normal for degree of bladder distention. Both ureteral jets were seen. Other: None. IMPRESSION: 1. Evaluation of the kidneys is difficult due to significant overlying bowel gas and patient immobility. Within these confines, no abnormality was identified. No hydronephrosis. 2. Normal bladder with both ureteral jets identified. Electronically Signed   By: Valetta Mole M.D.   On: 07/22/2021 08:50   DG Hip Unilat W or Wo Pelvis 2-3 Views  Left  Result Date: 07/21/2021 CLINICAL DATA:  Status post fall 2 days ago. EXAM: DG HIP (WITH OR WITHOUT PELVIS) 2-3V  LEFT COMPARISON:  June 18, 2021 FINDINGS: There is no evidence of hip fracture or dislocation. There is no evidence of arthropathy or other focal bone abnormality. IMPRESSION: Negative. Electronically Signed   By: Virgina Norfolk M.D.   On: 07/21/2021 16:18    Impression:  75 y.o. 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 Presenting this admission with falls, worsening ambulation, mental status changes with confusion. Admitted with acute on chronic renal injury, progressive weakness and confusion. Felt that Lyrica may be playing a role. GI consulted due to new onset rectal bleeding in setting of Heparin infusion.  Lower GI bleeding reported earlier this morning surrounding stool, small clots, and paper hematochezia. Will recheck H/H now. Last colonoscopy over 5 years ago in [place]; patient is unsure if polyps present. Query diverticular in setting of anticoagulation, unable to rule out occult malignancy. Patient will need ongoing anticoagulation, so colonoscopy is recommended to evaluate further. Does not appear to be rapid transit UGI bleeding. Notably, he has had chronic low-volume hematochezia in the past in setting of straining.   Anemia: Hgb 10/11 range over past few months. Admitting Hgb 10.0 and this morning 9.2. Will recheck H/H now. Heparin infusion on hold.   Hypercoagulable state: with DVT/PE in past. Has failed Xarelto and Coumadin as he develops VTEs while on this. Heparin on hold for  now.   Elevated LFTs: patient denies any history of this. Upon review of chart, he has had elevated alk phos and fluctuating transaminases in the past. Acute hepatitis panel negative in July 2020 at outside facility. Recommend rechecking HFP in am. Could consider [place]. No recent imaging. Holding off on extensive serologies at this point but will need follow-up.   Plan: Full liquids Recheck H/H Heparin infusion on hold HFP in am Colonoscopy +/- EGD  likely on 10/12 to further evaluate rectal bleeding   Annitta Needs, PhD, ANP-BC [facility]     LOS: 1 day    07/22/2021, 10:49 AM

## 2021-07-22 NOTE — ED Notes (Signed)
Pt repositioned, extra blankets given.

## 2021-07-22 NOTE — Progress Notes (Signed)
Patient Demographics:    Carlos Rodriguez, is a 75 y.o. male, DOB - 1946-09-28, DZH:[id number]  Admit date - 07/21/2021   Admitting Physician Keyshawna Prouse Denton Brick, MD  Outpatient Primary MD for the patient is System, Provider Not In  LOS - 1   Chief Complaint  Patient presents with   Weakness        Subjective:    [facility] today has no fevers, no emesis,  No chest pain,  -Episode of bloody stool in the ED while on IV heparin drip -discussed with patient's wife   Assessment  & Plan :    Principal Problem:   Acute renal failure superimposed on stage 4 chronic kidney disease ([place]) Active Problems:   Chronic anticoagulation - on lifelong lovenox 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 while on coumadin and Xarelto   Lymphedema of right lower extremity   Paroxysmal atrial fibrillation ([place])   Stiff person syndrome   Essential hypertension   Inability to walk - likely due to Lyrica   Hyperkalemia   AKI (acute kidney injury) [facility])  Brief Summary:- 75 year old African-American male with a history of CKD stage IV, hypertension, history of hypercoagulable state on chronic anticoagulation, paroxysmal atrial fibrillation, 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, history of prostate cancer status post prior posterior resection with subsequent radiation therapy, recent right rotator cuff surgery at [facility] in [facility] admitted with altered mentation and generalized weakness/falls and AKI on CKD  A/p 1)Lower Gi Bleed--patient apparently has had constipation due to opiate use for right shoulder surgery, he was straining, -Discussed with GI service plans for colonoscopy on 07/24/2021 -By GI service okay to continue IV heparin for now Heparin will need to be held for 6 hours prior to surgery -Follow H&H  2)Recurrent VTE/very hypercoagulable  state----patient actually had breakthrough VTE while on Coumadin and also while on Xarelto -PTA was on Lovenox, which may need to be adjusted given worsening renal function -For now IV heparin as above #1 unless GI bleed reoccurs at which point we will stop the heparin  3)AKI----acute kidney injury on CKD stage -IV    -Baseline creatinine previously under 3 -Renal ultrasound without obstructive uropathy -On admission creatinine was 4.78 currently improving with hydration Renally adjust medications, avoid nephrotoxic agents / dehydration  / hypotension  4) hyperkalemia--- in the setting of #3 above, Lokelma given  5) acute on chronic anemia--- hemoglobin currently around 10, monitor closely given rectal bleeding concerns  6)PAFib--anticoagulation as above #2  7) chronic right lower extremity lymphedema--secondary to prior osteosarcoma surgery with lymph node dissection -Supportive care patient has a lymphedema pump at home  8)HTN--- losartan on hold due to AKI, BP not ordered  9) generalized weakness and deconditioning/ambulatory dysfunction and falls--- multifactorial, patient was recently started on Lyrica and oxycodone in the setting of worsening renal function and has been weak, somewhat confused at times and falling -- get physical therapy and occupational therapy eval  Disposition/Need for in-Hospital Stay- patient unable to be discharged at this time due to --- AKI requiring IV fluids rectal bleeding requiring endoluminal evaluation, 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 Barriers: Not Clinically Stable-   Code Status :  -  Code Status: Full Code   Family Communication:  (patient  is alert, awake and coherent)  Discussed with wife  Consults  :  Gi/Nephrology  DVT Prophylaxis  :   - SCDs   Foot Pump / plexipulse Start: 07/21/21 2041    Lab Results  Component Value Date   PLT 131 (L) 07/22/2021    Inpatient Medications  Scheduled Meds:  amLODipine  5 mg Oral Daily   mirabegron ER  50 mg Oral Daily   pantoprazole (PROTONIX) IV  40 mg Intravenous Q12H   Continuous Infusions:  sodium chloride     PRN Meds:.acetaminophen **OR** acetaminophen, melatonin, ondansetron **OR** ondansetron (ZOFRAN) IV, oxyCODONE    Anti-infectives (From admission, onward)    None         Objective:   Vitals:   07/22/21 1146 07/22/21 1200 07/22/21 1215 07/22/21 1230  BP: 135/82 128/73  (!) 141/79  Pulse:  (!) 57 (!) 56 (!) 55  Resp:  17    Temp:      TempSrc:      SpO2:  100% 100% 99%  Weight:      Height:        Wt Readings from Last 3 Encounters:  07/21/21 93 kg     Intake/Output Summary (Last 24 hours) at 07/22/2021 1253 Last data filed at 07/22/2021 0110 Gross per 24 hour  Intake 532.21 ml  Output --  Net 532.21 ml     Physical Exam  Gen:- Awake Alert, in no acute distress HEENT:- [place].AT, No sclera icterus Neck-Supple Neck,No JVD,.  Lungs-  CTAB , fair symmetrical air movement CV- S1, S2 normal, regular  Abd-  +ve B.Sounds, Abd Soft, No tenderness,    Extremity/Skin:-Significant lymphedema of the right lower extremity  --pedal pulses present  Psych-affect is appropriate, oriented x3 Neuro-no new focal deficits, no tremors MSK-right shoulder sling due to recent surgery of the rotator cuff   Data Review:   Micro Results Recent Results (from the past 240 hour(s))  Resp Panel by RT-PCR (Flu A&B, Covid) Nasopharyngeal Swab     Status: None   Collection Time: 07/22/21  3:22 AM   Specimen: Nasopharyngeal Swab; Nasopharyngeal(NP) swabs in vial transport medium  Result Value Ref Range Status   SARS Coronavirus 2 by RT PCR NEGATIVE NEGATIVE  Final    Comment: (NOTE) SARS-CoV-2 target nucleic acids are NOT DETECTED.  The SARS-CoV-2 RNA is generally detectable in upper respiratory specimens during the acute phase of infection. The lowest concentration of SARS-CoV-2 viral copies this assay can detect is 138 copies/mL. A negative result does not preclude SARS-Cov-2 infection and should not be used as the sole basis for treatment or other patient management decisions. A negative result may occur with  improper specimen collection/handling, submission of specimen other than nasopharyngeal swab, presence of viral mutation(s) within the areas targeted by this assay, and inadequate number of viral copies(<138 copies/mL). A negative result must be combined with clinical observations, patient history, and epidemiological information. The expected result is Negative.  Fact Sheet for Patients:  EntrepreneurPulse.com.au  Fact Sheet for Healthcare Providers:  IncredibleEmployment.be  This test is no t yet approved or cleared by the [place] FDA and  has been authorized for detection and/or diagnosis of SARS-CoV-2 by FDA under an Emergency Use Authorization (EUA). This EUA will remain  in effect (meaning this test can be used) for the duration of the COVID-19 declaration under Section 564(b)(1) of the Act, 21 U.S.C.section 360bbb-3(b)(1), unless the authorization is terminated  or revoked sooner.       Influenza A by PCR NEGATIVE NEGATIVE Final   Influenza B by PCR NEGATIVE NEGATIVE Final    Comment: (NOTE) The Xpert Xpress SARS-CoV-2/FLU/RSV plus assay is intended as an aid in the diagnosis of influenza from Nasopharyngeal swab specimens and should not be used as a sole basis for treatment. Nasal washings and aspirates are unacceptable for Xpert Xpress SARS-CoV-2/FLU/RSV testing.  Fact Sheet for Patients: EntrepreneurPulse.com.au  Fact Sheet for Healthcare  Providers: IncredibleEmployment.be  This test is not yet approved or cleared by the [place] FDA and has been authorized for detection and/or diagnosis of SARS-CoV-2 by FDA under an Emergency Use Authorization (EUA). This EUA will remain in effect (meaning this test can be used) for the duration of the COVID-19 declaration under Section 564(b)(1) of the Act, 21 U.S.C. section 360bbb-3(b)(1), unless the authorization is terminated or revoked.  Performed at [facility], [address], [place], [place] [address]     Radiology Reports DG Lumbar Spine Complete  Result Date: 07/21/2021 CLINICAL DATA:  Status post fall. EXAM: LUMBAR SPINE - COMPLETE 4+ VIEW COMPARISON:  June 18, 2021 FINDINGS: There is no evidence of lumbar spine fracture. Alignment is normal. Marked severity endplate sclerosis is seen at the levels of L3-L4, L4-L5 and L5-S1. Moderate to marked severity intervertebral disc space narrowing is also seen at these levels. Radiopaque surgical clips are seen within the right upper quadrant. Inferior vena cava filter is noted. IMPRESSION: 1. No acute findings in the lumbar spine. 2. 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 Electronically Signed   By: Virgina Norfolk M.D.   On: 07/21/2021 16:19   CT Head Wo Contrast  Result Date: 07/21/2021 CLINICAL DATA:  Mental status change. EXAM: CT HEAD WITHOUT CONTRAST TECHNIQUE: Contiguous axial images were obtained from the base of the skull through the vertex without intravenous contrast. COMPARISON:  None. BRAIN: BRAIN Cerebral ventricle sizes are concordant with the degree of cerebral volume loss. Cerebral ventricle sizes are concordant with the degree of cerebral volume loss. Patchy and confluent areas of decreased attenuation are noted throughout the deep and periventricular white matter of the cerebral hemispheres bilaterally, compatible with chronic microvascular ischemic disease. No evidence  of large-territorial acute infarction. No parenchymal hemorrhage. No mass lesion. No extra-axial collection. No mass effect or midline shift. No hydrocephalus. Basilar cisterns are patent. Vascular: No hyperdense vessel. Atherosclerotic calcifications are present within the cavernous internal carotid arteries. Skull: No acute fracture or focal lesion. Sinuses/Orbits: Paranasal sinuses and mastoid air cells are clear. Bilateral lens replacement. Orbits are unremarkable. Other: None. IMPRESSION: No acute intracranial abnormality. Electronically Signed   By: Iven Finn M.D.   On: 07/21/2021 16:31   US RENAL  Result Date: 07/22/2021 CLINICAL DATA:  AKI EXAM: RENAL / URINARY TRACT ULTRASOUND COMPLETE COMPARISON:  None. FINDINGS: Evaluation of the kidneys is limited due to significant overlying bowel gas and patient immobility due to recent surgery. Right Kidney: The right lower pole is not well evaluated. Approximate renal measurements: 6.4 cm  x 4.7 cm x 4.8 cm = volume: 76 cc mL. Echogenicity within normal limits. No definite mass or hydronephrosis visualized. Left Kidney: Approximate renal measurements: 8.5 cm x 5.0 cm x 5.6 cm = volume: 123 mL. Echogenicity within normal limits. No mass or hydronephrosis visualized. Bladder: Appears normal for degree of bladder distention. Both ureteral jets were seen. Other: None. IMPRESSION: 1. Evaluation of the kidneys is difficult due to significant overlying bowel gas and patient immobility. Within these confines, no abnormality was identified. No hydronephrosis. 2. Normal bladder with both ureteral jets identified. Electronically Signed   By: Valetta Mole M.D.   On: 07/22/2021 08:50   DG Hip Unilat W or Wo Pelvis 2-3 Views Left  Result Date: 07/21/2021 CLINICAL DATA:  Status post fall 2 days ago. EXAM: DG HIP (WITH OR WITHOUT PELVIS) 2-3V LEFT COMPARISON:  June 18, 2021 FINDINGS: There is no evidence of hip fracture or dislocation. There is no evidence of  arthropathy or other focal bone abnormality. IMPRESSION: Negative. Electronically Signed   By: Virgina Norfolk M.D.   On: 07/21/2021 16:18     CBC Recent Labs  Lab 07/21/21 1505 07/22/21 0646  WBC 6.9 6.0  HGB 10.0* 9.2*  HCT 32.1* 29.0*  PLT 151 131*  MCV 97.3 94.8  MCH 30.3 30.1  MCHC 31.2 31.7  RDW 14.9 14.7  LYMPHSABS 0.9 1.1  MONOABS 0.6 0.6  EOSABS 0.2 0.2  BASOSABS 0.0 0.0    Chemistries  Recent Labs  Lab 07/21/21 1505 07/22/21 0646  NA 136 136  K 5.4* 5.6*  CL 109 113*  CO2 21* 17*  GLUCOSE 103* 107*  BUN 63* 60*  CREATININE 4.78* 4.21*  CALCIUM 9.3 8.9  MG  --  2.1  AST 77* 51*  ALT 34 27  ALKPHOS 180* 156*  BILITOT 0.8 0.7   ------------------------------------------------------------------------------------------------------------------ No results for input(s): CHOL, HDL, LDLCALC, TRIG, CHOLHDL, LDLDIRECT in the last 72 hours.  No results found for: HGBA1C ------------------------------------------------------------------------------------------------------------------ No results for input(s): TSH, T4TOTAL, T3FREE, THYROIDAB in the last 72 hours.  Invalid input(s): FREET3 ------------------------------------------------------------------------------------------------------------------ No results for input(s): VITAMINB12, FOLATE, FERRITIN, TIBC, IRON, RETICCTPCT in the last 72 hours.  Coagulation profile No results for input(s): INR, PROTIME in the last 168 hours.  No results for input(s): DDIMER in the last 72 hours.  Cardiac Enzymes No results for input(s): CKMB, TROPONINI, MYOGLOBIN in the last 168 hours.  Invalid input(s): CK ------------------------------------------------------------------------------------------------------------------ No results found for: BNP   Roxan Hockey M.D on 07/22/2021 at 12:53 PM  Go to [email or web address] - for contact info  [facility] - Office  [phone]

## 2021-07-22 NOTE — Progress Notes (Addendum)
[place] for heparin Indication:  history of multiple VTE on Lovenox PTA  Heparin Dosing Weight: 87.7kg  Labs: Recent Labs    07/21/21 1505 07/22/21 0646 07/22/21 1442  HGB 10.0* 9.2* 10.2*  HCT 32.1* 29.0* 33.2*  PLT 151 131*  --   HEPARINUNFRC  --  0.38 <0.10*  CREATININE 4.78* 4.21*  --   CKTOTAL 745*  --   --      Assessment: 10 yom presenting with history of VTE, afib, and hypercoagulable state on Lovenox PTA presenting with weakness, found to have AKI. SCr 4.78 on presentation (baseline ~2.8-3.2). Pharmacy consulted to transition from PTA Lovenox to heparin infusion for now with AKI. Patient reported last dose 10/10 PTA per med rec, but wife/patient told MD Bridgett Larsson) it has been >24hrs since last Lovenox dose. Last fill documented is 30-day supply on 05/23/21.  Per discussion with Dr. Bridgett Larsson, start heparin drip with no initial bolus in case actually received Lovenox today and target lower goal. Hg 10, plt wnl. No active bleed issues reported.  10/11 1715: MD called and okay to restart heparin. No boluses  Goal of Therapy:  Heparin level 0.3-0.5 units/ml Monitor platelets by anticoagulation protocol: Yes   Plan:  Restart heparin infusion at 1050 units/hr Check 8hr heparin level and daily heparin level Monitor daily CBC, s/sx bleeding F/u Nephrology recommendations and plans for long-term anticoagulation  Isac Sarna, BS [name], BCPS Clinical Pharmacist Pager [phone] 07/22/2021 5:17 PM

## 2021-07-22 NOTE — Progress Notes (Signed)
[facility] for heparin Indication:  history of multiple VTE on Lovenox PTA  Heparin Dosing Weight: 87.7kg  Labs: Recent Labs    07/21/21 1505 07/22/21 0646  HGB 10.0* 9.2*  HCT 32.1* 29.0*  PLT 151 131*  HEPARINUNFRC  --  0.38  CREATININE 4.78* 4.21*  CKTOTAL 745*  --      Assessment: 53 yom presenting with history of VTE, afib, and hypercoagulable state on Lovenox PTA presenting with weakness, found to have AKI. SCr 4.78 on presentation (baseline ~2.8-3.2). Pharmacy consulted to transition from PTA Lovenox to heparin infusion for now with AKI. Patient reported last dose 10/10 PTA per med rec, but wife/patient told MD Bridgett Larsson) it has been >24hrs since last Lovenox dose. Last fill documented is 30-day supply on 05/23/21.  Per discussion with Dr. Bridgett Larsson, start heparin drip with no initial bolus in case actually received Lovenox today and target lower goal. Hg 10, plt wnl. No active bleed issues reported.  HL 0.38, therapeutic  Goal of Therapy:  Heparin level 0.3-0.5 units/ml Monitor platelets by anticoagulation protocol: Yes   Plan:  Continue heparin infusion at 1050 units/hr Check 8hr heparin level and daily heparin level Monitor daily CBC, s/sx bleeding F/u Nephrology recommendations and plans for long-term anticoagulation  Isac Sarna, BS [name], BCPS Clinical Pharmacist Pager [phone] 07/22/2021 9:03 AM

## 2021-07-23 ENCOUNTER — Encounter (HOSPITAL_COMMUNITY)
Admission: EM | Disposition: A | Discharge status: Home / Self Care | Payer: Self-pay | Source: Home / Self Care | Attending: Family Medicine

## 2021-07-23 DIAGNOSIS — N179 Acute kidney failure, unspecified: Secondary | ICD-10-CM | POA: Diagnosis not present

## 2021-07-23 DIAGNOSIS — I480 Paroxysmal atrial fibrillation: Secondary | ICD-10-CM | POA: Diagnosis not present

## 2021-07-23 DIAGNOSIS — N184 Chronic kidney disease, stage 4 (severe): Secondary | ICD-10-CM | POA: Diagnosis not present

## 2021-07-23 DIAGNOSIS — I10 Essential (primary) hypertension: Secondary | ICD-10-CM | POA: Diagnosis not present

## 2021-07-23 DIAGNOSIS — R7989 Other specified abnormal findings of blood chemistry: Secondary | ICD-10-CM

## 2021-07-23 DIAGNOSIS — D649 Anemia, unspecified: Secondary | ICD-10-CM

## 2021-07-23 LAB — RENAL FUNCTION PANEL
Albumin: 2.5 g/dL — ABNORMAL LOW (ref 3.5–5.0)
Anion gap: 5 (ref 5–15)
BUN: 55 mg/dL — ABNORMAL HIGH (ref 8–23)
CO2: 19 mmol/L — ABNORMAL LOW (ref 22–32)
Calcium: 9.1 mg/dL (ref 8.9–10.3)
Chloride: 115 mmol/L — ABNORMAL HIGH (ref 98–111)
Creatinine, Ser: 3.71 mg/dL — ABNORMAL HIGH (ref 0.61–1.24)
GFR, Estimated: 16 mL/min — ABNORMAL LOW (ref >60)
Glucose, Bld: 83 mg/dL (ref 70–99)
Phosphorus: 4 mg/dL (ref 2.5–4.6)
Potassium: 5.3 mmol/L — ABNORMAL HIGH (ref 3.5–5.1)
Sodium: 139 mmol/L (ref 135–145)

## 2021-07-23 LAB — HEPATIC FUNCTION PANEL
ALT: 22 U/L (ref 0–44)
AST: 40 U/L (ref 15–41)
Albumin: 2.6 g/dL — ABNORMAL LOW (ref 3.5–5.0)
Alkaline Phosphatase: 151 U/L — ABNORMAL HIGH (ref 38–126)
Bilirubin, Direct: 0.2 mg/dL (ref 0.0–0.2)
Indirect Bilirubin: 0.6 mg/dL (ref 0.3–0.9)
Total Bilirubin: 0.8 mg/dL (ref 0.3–1.2)
Total Protein: 6.2 g/dL — ABNORMAL LOW (ref 6.5–8.1)

## 2021-07-23 LAB — CBC
HCT: 29.4 % — ABNORMAL LOW (ref 39.0–52.0)
Hemoglobin: 9.3 g/dL — ABNORMAL LOW (ref 13.0–17.0)
MCH: 30.2 pg (ref 26.0–34.0)
MCHC: 31.6 g/dL (ref 30.0–36.0)
MCV: 95.5 fL (ref 80.0–100.0)
Platelets: 138 10*3/uL — ABNORMAL LOW (ref 150–400)
RBC: 3.08 MIL/uL — ABNORMAL LOW (ref 4.22–5.81)
RDW: 14.6 % (ref 11.5–15.5)
WBC: 5.2 10*3/uL (ref 4.0–10.5)
nRBC: 0 % (ref 0.0–0.2)

## 2021-07-23 LAB — HEPARIN LEVEL (UNFRACTIONATED): Heparin Unfractionated: 0.46 IU/mL (ref 0.30–0.70)

## 2021-07-23 SURGERY — COLONOSCOPY WITH PROPOFOL
Anesthesia: Monitor Anesthesia Care

## 2021-07-23 MED ORDER — PEG 3350-KCL-NA BICARB-NACL 420 G PO SOLR
4000.0000 mL | Freq: Once | ORAL | Status: AC
  Administered 2021-07-23: 4000 mL via ORAL

## 2021-07-23 MED ORDER — SODIUM CHLORIDE 0.9 % IV SOLN: INTRAVENOUS | Status: DC

## 2021-07-23 MED ORDER — BISACODYL 5 MG PO TBEC
10.0000 mg | DELAYED_RELEASE_TABLET | Freq: Once | ORAL | Status: AC
  Administered 2021-07-23: 10 mg via ORAL
  Filled 2021-07-23: qty 2

## 2021-07-23 MED ORDER — SODIUM ZIRCONIUM CYCLOSILICATE 10 G PO PACK
10.0000 g | PACK | Freq: Once | ORAL | Status: AC
  Administered 2021-07-23: 10 g via ORAL
  Filled 2021-07-23: qty 1

## 2021-07-23 MED ORDER — BISACODYL 5 MG PO TBEC: 10.0000 mg | DELAYED_RELEASE_TABLET | Freq: Every day | ORAL | Status: DC | PRN

## 2021-07-23 NOTE — Progress Notes (Addendum)
[place] for heparin Indication:  history of multiple VTE on Lovenox PTA  Heparin Dosing Weight: 87.7kg  Labs: Recent Labs    07/21/21 1505 07/22/21 0646 07/22/21 1442 07/23/21 0647  HGB 10.0* 9.2* 10.2*  --   HCT 32.1* 29.0* 33.2*  --   PLT 151 131*  --   --   HEPARINUNFRC  --  0.38 <0.10* 0.46  CREATININE 4.78* 4.21*  --  3.71*  CKTOTAL 745*  --   --   --      Assessment: 3 yom presenting with history of VTE, afib, and hypercoagulable state on Lovenox PTA presenting with weakness, found to have AKI. SCr 4.78 on presentation (baseline ~2.8-3.2). Pharmacy consulted to transition from PTA Lovenox to heparin infusion for now with AKI. Patient reported last dose 10/10 PTA per med rec, but wife/patient told MD Bridgett Larsson) it has been >24hrs since last Lovenox dose. Last fill documented is 30-day supply on 05/23/21.  Per discussion with Dr. Bridgett Larsson, start heparin drip with no initial bolus in case actually received Lovenox today and target lower goal. Hg 10, plt wnl. No active bleed issues reported.  10/11 1715: MD called and okay to restart heparin. No boluses 10/12 RN said patient was noted to have large blood clots on bed pan and in the commode, bright red blood this AM. Heparin was held. CBC was improved from yesterday.  HL 0.46, therapeutic  Goal of Therapy:  Heparin level 0.3-0.5 units/ml Monitor platelets by anticoagulation protocol: Yes   Plan:  Heparin on hold per RN notes ( heparin infusion  was at 1050 units/hr) Check daily heparin level Monitor daily CBC, s/sx bleeding F/u GI/Nephrology recommendations and plans for long-term anticoagulation  Isac Sarna, BS [name], BCPS Clinical Pharmacist Pager [phone] 07/23/2021 8:15 AM

## 2021-07-23 NOTE — Plan of Care (Addendum)
Pt noted to have confusion during overnight. Pt forgot he was in the hospital. Pulled condom cath off. Pt awoke this am and clots noted on bedpad and in commode. Dr. Josephine Cables msg awaiting response.  Problem: Education: Goal: Knowledge of General Education information will improve Description: Including pain rating scale, medication(s)/side effects and non-pharmacologic comfort measures Outcome: Progressing   Problem: Health Behavior/Discharge Planning: Goal: Ability to manage health-related needs will improve Outcome: Progressing   Problem: Clinical Measurements: Goal: Ability to maintain clinical measurements within normal limits will improve Outcome: Progressing Goal: Will remain free from infection Outcome: Progressing Goal: Diagnostic test results will improve Outcome: Progressing Goal: Respiratory complications will improve Outcome: Progressing Goal: Cardiovascular complication will be avoided Outcome: Progressing   Problem: Activity: Goal: Risk for activity intolerance will decrease Outcome: Progressing   Problem: Nutrition: Goal: Adequate nutrition will be maintained Outcome: Progressing   Problem: Coping: Goal: Level of anxiety will decrease Outcome: Progressing   Problem: Elimination: Goal: Will not experience complications related to bowel motility Outcome: Progressing Goal: Will not experience complications related to urinary retention Outcome: Progressing   Problem: Pain Managment: Goal: General experience of comfort will improve Outcome: Progressing   Problem: Safety: Goal: Ability to remain free from injury will improve Outcome: Progressing   Problem: Skin Integrity: Goal: Risk for impaired skin integrity will decrease Outcome: Progressing

## 2021-07-23 NOTE — Progress Notes (Signed)
RN called due to patient who was on heparin drip was noted to have large blood clots on bed pan and in the commode, bright red blood was also noted in the commode.  Heparin drip was temporarily held at this time.  H/H this morning was 10.2/33.2 (this was 9.2/29.0 yesterday)

## 2021-07-23 NOTE — Progress Notes (Signed)
Patient Demographics:    Carlos Rodriguez, is a 75 y.o. male, DOB - 1946/03/16, JFH:[id number]  Admit date - 07/21/2021   Admitting Physician Inette Doubrava Denton Brick, MD  Outpatient Primary MD for the patient is System, Provider Not In  LOS - 2   Chief Complaint  Patient presents with   Weakness        Subjective:    [facility] today has no fevers, no emesis,  No chest pain,  -- Patient had another episode of rectal bleeding on heparin IV drip was stopped again -Denies secondary abdominal pain at this time -He is okay with doing a colonoscopy prep today   Assessment  & Plan :    Principal Problem:   Acute renal failure superimposed on stage 4 chronic kidney disease ([place]) Active Problems:   Chronic anticoagulation - on lifelong lovenox 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 while on coumadin and Xarelto   Lymphedema of right lower extremity   Paroxysmal atrial fibrillation ([place])   Stiff person syndrome   Essential hypertension   Inability to walk - likely due to Lyrica   Hyperkalemia   AKI (acute kidney injury) ([place])   Elevated LFTs   Anemia  Brief Summary:- 75 year old African-American male with a history of CKD stage IV, hypertension, history of hypercoagulable state on chronic anticoagulation, paroxysmal atrial fibrillation, 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, history of prostate cancer status post prior posterior resection with subsequent radiation therapy, recent right rotator cuff surgery at [facility] in [place] [facility] admitted with altered mentation and generalized weakness/falls and AKI on CKD  A/p 1)Lower Gi Bleed--patient apparently has had constipation due to opiate use for right shoulder surgery, he was straining, -Discussed with GI service plans for colonoscopy on 07/24/2021 -IV heparin discontinued due to recurrent GI  bleed -Follow H&H  2)Recurrent VTE/very hypercoagulable state----patient actually had breakthrough VTE while on Coumadin and also while on Xarelto -PTA was on Lovenox, which may need to be adjusted given worsening renal function -IV heparin on hold due to recurrent GI bleed  3)AKI----acute kidney injury on CKD stage -IV    -Baseline creatinine previously under 3 -Renal ultrasound without obstructive uropathy -On admission creatinine was 4.78 currently improving with hydration Renally adjust medications, avoid nephrotoxic agents / dehydration  / hypotension  4) hyperkalemia--- in the setting of #3 above, potassium still elevated repeat Lokelma -Anticipate potassium will improve with GoLytely induced diarrhea as patient drinks his colon prep  5) acute on chronic anemia--- hemoglobin currently around 10, monitor closely given rectal bleeding concerns  6)PAFib--anticoagulation as above #2  7) chronic right lower extremity lymphedema--secondary to prior osteosarcoma surgery with lymph node dissection -Supportive care patient has a lymphedema pump at home  8)HTN--- losartan on hold due to AKI, BP not ordered  9) generalized weakness and deconditioning/ambulatory dysfunction and falls--- multifactorial, patient was recently started on Lyrica and oxycodone in the setting of worsening renal function and has been weak, somewhat confused at times and falling -physical therapy and occupational therapy eval appreciated recommends outpatient versus home health PT  Disposition/Need for in-Hospital Stay- patient unable to be discharged at this time due to --- AKI requiring IV fluids rectal bleeding requiring endoluminal evaluation, recurrent DVTs  requiring  anticoagulation if no further GI bleed  Status is: Inpatient  Remains inpatient appropriate because: Please see disposition above  Disposition: The patient is from: Home              Anticipated d/c is to: SNF              Anticipated d/c date  is: 3 days              Patient currently is not medically stable to d/c. Barriers: Not Clinically Stable-   Code Status :  -  Code Status: Full Code   Family Communication:  (patient is alert, awake and coherent)  Discussed with wife  Consults  :  Gi/Nephrology  DVT Prophylaxis  :   - SCDs   Foot Pump / plexipulse Start: 07/21/21 2041    Lab Results  Component Value Date   PLT 138 (L) 07/23/2021    Inpatient Medications  Scheduled Meds:  amLODipine  5 mg Oral Daily   mirabegron ER  50 mg Oral Daily   pantoprazole (PROTONIX) IV  40 mg Intravenous Q12H   Continuous Infusions:  sodium chloride 100 mL/hr at 07/23/21 1006   PRN Meds:.acetaminophen **OR** acetaminophen, melatonin, ondansetron **OR** ondansetron (ZOFRAN) IV, oxyCODONE    Anti-infectives (From admission, onward)    None         Objective:   Vitals:   07/23/21 0113 07/23/21 0500 07/23/21 0507 07/23/21 1742  BP: (!) 145/82  124/75 140/84  Pulse: 63  (!) 53 65  Resp: 18  16 20   Temp: 98.2 F (36.8 C)  97.8 F (36.6 C) 98 F (36.7 C)  TempSrc: Oral  Oral Oral  SpO2: 99%  100% 100%  Weight:  98.1 kg    Height:        Wt Readings from Last 3 Encounters:  07/23/21 98.1 kg     Intake/Output Summary (Last 24 hours) at 07/23/2021 1842 Last data filed at 07/23/2021 0600 Gross per 24 hour  Intake 1410.08 ml  Output --  Net 1410.08 ml     Physical Exam  Gen:- Awake Alert, in no acute distress HEENT:- [place].AT, No sclera icterus Neck-Supple Neck,No JVD,.  Lungs-  CTAB , fair symmetrical air movement CV- S1, S2 normal, regular  Abd-  +ve B.Sounds, Abd Soft, No tenderness,    Extremity/Skin:-Significant lymphedema of the right lower extremity  --pedal pulses present  Psych-affect is appropriate, oriented x3 Neuro-no new focal deficits, no tremors MSK-right shoulder sling due to recent surgery of the rotator cuff   Data Review:   Micro Results Recent Results (from the past 240 hour(s))   Resp Panel by RT-PCR (Flu A&B, Covid) Nasopharyngeal Swab     Status: None   Collection Time: 07/22/21  3:22 AM   Specimen: Nasopharyngeal Swab; Nasopharyngeal(NP) swabs in vial transport medium  Result Value Ref Range Status   SARS Coronavirus 2 by RT PCR NEGATIVE NEGATIVE Final    Comment: (NOTE) SARS-CoV-2 target nucleic acids are NOT DETECTED.  The SARS-CoV-2 RNA is generally detectable in upper respiratory specimens during the acute phase of infection. The lowest concentration of SARS-CoV-2 viral copies this assay can detect is 138 copies/mL. A negative result does not preclude SARS-Cov-2 infection and should not be used as the sole basis for treatment or other patient management decisions. A negative result may occur with  improper specimen collection/handling, submission of specimen other than nasopharyngeal swab, presence of viral mutation(s) within the areas targeted by this assay,  and inadequate number of viral copies(<138 copies/mL). A negative result must be combined with clinical observations, patient history, and epidemiological information. The expected result is Negative.  Fact Sheet for Patients:  EntrepreneurPulse.com.au  Fact Sheet for Healthcare Providers:  IncredibleEmployment.be  This test is no t yet approved or cleared by the [place] FDA and  has been authorized for detection and/or diagnosis of SARS-CoV-2 by FDA under an Emergency Use Authorization (EUA). This EUA will remain  in effect (meaning this test can be used) for the duration of the COVID-19 declaration under Section 564(b)(1) of the Act, 21 U.S.C.section 360bbb-3(b)(1), unless the authorization is terminated  or revoked sooner.       Influenza A by PCR NEGATIVE NEGATIVE Final   Influenza B by PCR NEGATIVE NEGATIVE Final    Comment: (NOTE) The Xpert Xpress SARS-CoV-2/FLU/RSV plus assay is intended as an aid in the diagnosis of influenza from  Nasopharyngeal swab specimens and should not be used as a sole basis for treatment. Nasal washings and aspirates are unacceptable for Xpert Xpress SARS-CoV-2/FLU/RSV testing.  Fact Sheet for Patients: EntrepreneurPulse.com.au  Fact Sheet for Healthcare Providers: IncredibleEmployment.be  This test is not yet approved or cleared by the [place] FDA and has been authorized for detection and/or diagnosis of SARS-CoV-2 by FDA under an Emergency Use Authorization (EUA). This EUA will remain in effect (meaning this test can be used) for the duration of the COVID-19 declaration under Section 564(b)(1) of the Act, 21 U.S.C. section 360bbb-3(b)(1), unless the authorization is terminated or revoked.  Performed at [facility], [address] [place], [place] [address]     Radiology Reports DG Lumbar Spine Complete  Result Date: 07/21/2021 CLINICAL DATA:  Status post fall. EXAM: LUMBAR SPINE - COMPLETE 4+ VIEW COMPARISON:  June 18, 2021 FINDINGS: There is no evidence of lumbar spine fracture. Alignment is normal. Marked severity endplate sclerosis is seen at the levels of L3-L4, L4-L5 and L5-S1. Moderate to marked severity intervertebral disc space narrowing is also seen at these levels. Radiopaque surgical clips are seen within the right upper quadrant. Inferior vena cava filter is noted. IMPRESSION: 1. No acute findings in the lumbar spine. 2. 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 Electronically Signed   By: Virgina Norfolk M.D.   On: 07/21/2021 16:19   CT Head Wo Contrast  Result Date: 07/21/2021 CLINICAL DATA:  Mental status change. EXAM: CT HEAD WITHOUT CONTRAST TECHNIQUE: Contiguous axial images were obtained from the base of the skull through the vertex without intravenous contrast. COMPARISON:  None. BRAIN: BRAIN Cerebral ventricle sizes are concordant with the degree of cerebral volume loss. Cerebral ventricle sizes  are concordant with the degree of cerebral volume loss. Patchy and confluent areas of decreased attenuation are noted throughout the deep and periventricular white matter of the cerebral hemispheres bilaterally, compatible with chronic microvascular ischemic disease. No evidence of large-territorial acute infarction. No parenchymal hemorrhage. No mass lesion. No extra-axial collection. No mass effect or midline shift. No hydrocephalus. Basilar cisterns are patent. Vascular: No hyperdense vessel. Atherosclerotic calcifications are present within the cavernous internal carotid arteries. Skull: No acute fracture or focal lesion. Sinuses/Orbits: Paranasal sinuses and mastoid air cells are clear. Bilateral lens replacement. Orbits are unremarkable. Other: None. IMPRESSION: No acute intracranial abnormality. Electronically Signed   By: Iven Finn M.D.   On: 07/21/2021 16:31   US RENAL  Result Date: 07/22/2021 CLINICAL DATA:  AKI EXAM: RENAL / URINARY TRACT ULTRASOUND COMPLETE COMPARISON:  None. FINDINGS:  Evaluation of the kidneys is limited due to significant overlying bowel gas and patient immobility due to recent surgery. Right Kidney: The right lower pole is not well evaluated. Approximate renal measurements: 6.4 cm x 4.7 cm x 4.8 cm = volume: 76 cc mL. Echogenicity within normal limits. No definite mass or hydronephrosis visualized. Left Kidney: Approximate renal measurements: 8.5 cm x 5.0 cm x 5.6 cm = volume: 123 mL. Echogenicity within normal limits. No mass or hydronephrosis visualized. Bladder: Appears normal for degree of bladder distention. Both ureteral jets were seen. Other: None. IMPRESSION: 1. Evaluation of the kidneys is difficult due to significant overlying bowel gas and patient immobility. Within these confines, no abnormality was identified. No hydronephrosis. 2. Normal bladder with both ureteral jets identified. Electronically Signed   By: Valetta Mole M.D.   On: 07/22/2021 08:50   DG Hip  Unilat W or Wo Pelvis 2-3 Views Left  Result Date: 07/21/2021 CLINICAL DATA:  Status post fall 2 days ago. EXAM: DG HIP (WITH OR WITHOUT PELVIS) 2-3V LEFT COMPARISON:  June 18, 2021 FINDINGS: There is no evidence of hip fracture or dislocation. There is no evidence of arthropathy or other focal bone abnormality. IMPRESSION: Negative. Electronically Signed   By: Virgina Norfolk M.D.   On: 07/21/2021 16:18     CBC Recent Labs  Lab 07/21/21 1505 07/22/21 0646 07/22/21 1442 07/23/21 0848  WBC 6.9 6.0  --  5.2  HGB 10.0* 9.2* 10.2* 9.3*  HCT 32.1* 29.0* 33.2* 29.4*  PLT 151 131*  --  138*  MCV 97.3 94.8  --  95.5  MCH 30.3 30.1  --  30.2  MCHC 31.2 31.7  --  31.6  RDW 14.9 14.7  --  14.6  LYMPHSABS 0.9 1.1  --   --   MONOABS 0.6 0.6  --   --   EOSABS 0.2 0.2  --   --   BASOSABS 0.0 0.0  --   --     Chemistries  Recent Labs  Lab 07/21/21 1505 07/22/21 0646 07/23/21 0647  NA 136 136 139  K 5.4* 5.6* 5.3*  CL 109 113* 115*  CO2 21* 17* 19*  GLUCOSE 103* 107* 83  BUN 63* 60* 55*  CREATININE 4.78* 4.21* 3.71*  CALCIUM 9.3 8.9 9.1  MG  --  2.1  --   AST 77* 51* 40  ALT 34 27 22  ALKPHOS 180* 156* 151*  BILITOT 0.8 0.7 0.8   ------------------------------------------------------------------------------------------------------------------ No results for input(s): CHOL, HDL, LDLCALC, TRIG, CHOLHDL, LDLDIRECT in the last 72 hours.  No results found for: HGBA1C ------------------------------------------------------------------------------------------------------------------ No results for input(s): TSH, T4TOTAL, T3FREE, THYROIDAB in the last 72 hours.  Invalid input(s): FREET3 ------------------------------------------------------------------------------------------------------------------ No results for input(s): VITAMINB12, FOLATE, FERRITIN, TIBC, IRON, RETICCTPCT in the last 72 hours.  Coagulation profile No results for input(s): INR, PROTIME in the last 168  hours.  No results for input(s): DDIMER in the last 72 hours.  Cardiac Enzymes No results for input(s): CKMB, TROPONINI, MYOGLOBIN in the last 168 hours.  Invalid input(s): CK ------------------------------------------------------------------------------------------------------------------ No results found for: BNP   Roxan Hockey M.D on 07/23/2021 at 6:42 PM  Go to [email or web address] - for contact info  [facility] - Office  [phone]

## 2021-07-23 NOTE — Progress Notes (Signed)
Subjective: Patient reports that he noticed some blood in his stools this morning with clots. He denies any nausea or vomiting. He denies belly pain. He is tolerating liquid diet okay at this time. He is okay to proceed with colonoscopy tomorrow as initially planned.   Objective: Vital signs in last 24 hours: Temp:  [97.8 F (36.6 C)-98.2 F (36.8 C)] 97.8 F (36.6 C) (10/12 0507) Pulse Rate:  [53-63] 53 (10/12 0507) Resp:  [13-18] 16 (10/12 0507) BP: (124-145)/(70-83) 124/75 (10/12 0507) SpO2:  [84 %-100 %] 100 % (10/12 0507) Weight:  [98.1 kg] 98.1 kg (10/12 0500) Last BM Date: 07/23/21 General:   Alert and oriented, pleasant Head:  Normocephalic and atraumatic. Eyes:  No icterus, sclera clear. Conjuctiva pink.  Mouth:  Without lesions, mucosa pink and moist.  Heart:  S1, S2 present, no murmurs noted.  Lungs: Clear to auscultation bilaterally, without wheezing, rales, or rhonchi.  Abdomen:  Bowel sounds present, soft, non-tender, non-distended. No HSM or hernias noted. No rebound or guarding. No masses appreciated  Msk:  Symmetrical without gross deformities. Normal posture. Neurologic:  Alert and  oriented x4;  grossly normal neurologically. Skin:  Warm and dry, intact without significant lesions.  Psych:  Alert and cooperative. Normal mood and affect.  Intake/Output from previous day: 10/11 0701 - 10/12 0700 In: 2515.1 [P.O.:720; I.V.:1795.1] Out: 400 [Urine:400] Intake/Output this shift: No intake/output data recorded.  Lab Results: Recent Labs    07/21/21 1505 07/22/21 0646 07/22/21 1442 07/23/21 0848  WBC 6.9 6.0  --  5.2  HGB 10.0* 9.2* 10.2* 9.3*  HCT 32.1* 29.0* 33.2* 29.4*  PLT 151 131*  --  138*   BMET Recent Labs    07/21/21 1505 07/22/21 0646 07/23/21 0647  NA 136 136 139  K 5.4* 5.6* 5.3*  CL 109 113* 115*  CO2 21* 17* 19*  GLUCOSE 103* 107* 83  BUN 63* 60* 55*  CREATININE 4.78* 4.21* 3.71*  CALCIUM 9.3 8.9 9.1   LFT Recent Labs     07/21/21 1505 07/22/21 0646 07/23/21 0647  PROT 7.3 6.2* 6.2*  ALBUMIN 2.9* 2.6* 2.6*  2.5*  AST 77* 51* 40  ALT 34 27 22  ALKPHOS 180* 156* 151*  BILITOT 0.8 0.7 0.8  BILIDIR  --   --  0.2  IBILI  --   --  0.6   DG Lumbar Spine Complete  Result Date: 07/21/2021 CLINICAL DATA:  Status post fall. EXAM: LUMBAR SPINE - COMPLETE 4+ VIEW COMPARISON:  June 18, 2021 FINDINGS: There is no evidence of lumbar spine fracture. Alignment is normal. Marked severity endplate sclerosis is seen at the levels of L3-L4, L4-L5 and L5-S1. Moderate to marked severity intervertebral disc space narrowing is also seen at these levels. Radiopaque surgical clips are seen within the right upper quadrant. Inferior vena cava filter is noted. IMPRESSION: 1. No acute findings in the lumbar spine. 2. 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 Electronically Signed   By: Aram Candela M.D.   On: 07/21/2021 16:19   CT Head Wo Contrast  Result Date: 07/21/2021 CLINICAL DATA:  Mental status change. EXAM: CT HEAD WITHOUT CONTRAST TECHNIQUE: Contiguous axial images were obtained from the base of the skull through the vertex without intravenous contrast. COMPARISON:  None. BRAIN: BRAIN Cerebral ventricle sizes are concordant with the degree of cerebral volume loss. Cerebral ventricle sizes are concordant with the degree of cerebral volume loss. Patchy and confluent areas of decreased attenuation are noted throughout  the deep and periventricular white matter of the cerebral hemispheres bilaterally, compatible with chronic microvascular ischemic disease. No evidence of large-territorial acute infarction. No parenchymal hemorrhage. No mass lesion. No extra-axial collection. No mass effect or midline shift. No hydrocephalus. Basilar cisterns are patent. Vascular: No hyperdense vessel. Atherosclerotic calcifications are present within the cavernous internal carotid arteries. Skull: No acute fracture or focal  lesion. Sinuses/Orbits: Paranasal sinuses and mastoid air cells are clear. Bilateral lens replacement. Orbits are unremarkable. Other: None. IMPRESSION: No acute intracranial abnormality. Electronically Signed   By: Iven Finn M.D.   On: 07/21/2021 16:31   US RENAL  Result Date: 07/22/2021 CLINICAL DATA:  AKI EXAM: RENAL / URINARY TRACT ULTRASOUND COMPLETE COMPARISON:  None. FINDINGS: Evaluation of the kidneys is limited due to significant overlying bowel gas and patient immobility due to recent surgery. Right Kidney: The right lower pole is not well evaluated. Approximate renal measurements: 6.4 cm x 4.7 cm x 4.8 cm = volume: 76 cc mL. Echogenicity within normal limits. No definite mass or hydronephrosis visualized. Left Kidney: Approximate renal measurements: 8.5 cm x 5.0 cm x 5.6 cm = volume: 123 mL. Echogenicity within normal limits. No mass or hydronephrosis visualized. Bladder: Appears normal for degree of bladder distention. Both ureteral jets were seen. Other: None. IMPRESSION: 1. Evaluation of the kidneys is difficult due to significant overlying bowel gas and patient immobility. Within these confines, no abnormality was identified. No hydronephrosis. 2. Normal bladder with both ureteral jets identified. Electronically Signed   By: Valetta Mole M.D.   On: 07/22/2021 08:50   DG Hip Unilat W or Wo Pelvis 2-3 Views Left  Result Date: 07/21/2021 CLINICAL DATA:  Status post fall 2 days ago. EXAM: DG HIP (WITH OR WITHOUT PELVIS) 2-3V LEFT COMPARISON:  June 18, 2021 FINDINGS: There is no evidence of hip fracture or dislocation. There is no evidence of arthropathy or other focal bone abnormality. IMPRESSION: Negative. Electronically Signed   By: Virgina Norfolk M.D.   On: 07/21/2021 16:18    Assessment: 75 year old male with history of CKD, HTN, hypercoagulable state on chronic anticoagulation (lovenox), DVT/PE (2001), paroxysmal a fib, previous osteosarcoma of R leg s/p removal with  subsequent lymphedema, history of prostate cancer with RALP in Aug 2019 and radiation therapy, R rotator cuff surgery at [facility] several weeks ago and d/c on Lyrica, Zanaflex, and oxycodone, who presented this admission with falls, worsening ambulation, AMS/confusion. He was admitted with acute on chronic renal injury, progressive weakness and confusion. It was considered that Lyrica/polypharmacy may be causing some of his symptoms. GI consulted for new onset rectal bleeding in setting of Heparin infusion.   Patient began having small blood clots with BM and hematochezia noted when wiping yesterday morning. He denied any NSAID use. Continued hematochezia with clots noted by Nursing staff in bedpan this morning. Heparin was put on hold at that time. Last colonoscopy was 5 years ago in [place], patient unsure of results at that time. Patient will need ongoing anticoagulation given his history. Hx of low volume hematochezia in the past r/t straining, and reports recent straining with BMs r/t oxycodone post surgery,  however, cannot rule out diverticular bleed or malignancy at this time. Patient will be scheduled for colonoscopy tomorrow as long as hgb and INR remain stable.   Anemia: Hgb range 10-11 over the past few months. Hgb 10 on admission, 9.3 this morning. Appears to be trending between 9-10 during this admission.   Hypercoagulable state: hx  of DVT/PE in past. Has failed xarelto and coumadin in the past. Heparin on hold currently r/t rectal bleeding, recurrent hematochezia and blood clots noted with BMs this morning.   Elevated LFTs: Alk phos 180 upon admission, 151 today. Transaminases WNL this a.m. No history of known liver disease/elevated LFTs in the past, per patient. Upon review of EMR, patient has had elevated Alk Phos with fluctuating transaminases in the past. Acute Hepatitis panel neg in July 2020 from outside facility. of note CK was elevated at 745 two days ago, alk phos likely  elevated secondary to elevated CK.   Indications, risks and benefits of procedure discussed in detail with patient. Patient verbalized understanding and is in agreement to proceed with colonoscopy tomorrow.   Plan: Clear liquid diet today, NPO at midnight Start bowel prep today Continue to hold heparin at this time Continue to trend H&H and LFTs Monitor for GI bleeding   LOS: 2 days    07/23/2021, 10:20 AM  Muriah Harsha L. Alver Sorrow, MSN, APRN, AGNP-C Adult-Gerontology Nurse Practitioner [facility]

## 2021-07-23 NOTE — Evaluation (Signed)
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  Patient had right rotator cuff surgery performed at [facility] hospital 2 weeks ago.  Patient was discharged on Lyrica, Zanaflex and oxycodone.  Since that time, the patient has been having increasing ability was stumbling and falling.  Patient fell 3 days ago while walking up the front of his stairs to his home.  Likely his wife was behind him and he fell on top of her.   Clinical Impression   Pt agreeable to OT/PT co-evaluation. Pt presenting in R shoulder sling with NWB status. Pt's sling adjusted with education in ways to maintain NWB status. Pt demonstrates independence with bed mobility and moderate assist for donning socks seated in a chair. Pt was able to ambulate with Mod I level of assist due to mild labored movement likely impacted by pt holding hand held urinal due to difficulty controlling urination. Pt reportedly had plans for rehab for R shoulder, which the pt described as a R shoulder replacement. Pt is not recommended for further acute OT services and will be discharged to care of nursing staff for the remaining length of stay.      Recommendations for follow up therapy are one component of a multi-disciplinary discharge planning process, led by the attending physician.  Recommendations may be updated based on patient status, additional functional criteria and insurance authorization.   Follow Up Recommendations  Outpatient OT;Other (comment) (Recommend pt continue with plan for  outpatient rehab for R shoulder.)    Equipment Recommendations  None recommended by OT           Precautions / Restrictions Precautions Precautions: Fall Restrictions Weight Bearing Restrictions: Yes RUE Weight Bearing: Non weight bearing      Mobility Bed Mobility Overal bed mobility: Independent                  Transfers Overall transfer level: Modified independent               General transfer comment: Pt able to ambulate without AD. Mild slow movement likely due to pt holding urinal during ambulation/transfer.    Balance Overall balance assessment: Mild deficits observed, not formally tested                                         ADL either performed or assessed with clinical judgement   ADL Overall ADL's : Needs assistance/impaired                     Lower Body Dressing: Moderate assistance;Sitting/lateral leans Lower Body Dressing Details (indicate cue type and reason): Pt able to don L LE sock with assist for R LE. Toilet Transfer: Modified Independent;Ambulation Toilet Transfer Details (indicate cue type and reason): Simulated via EOB to chair transfer with ambulation; Mild slow movement with transfer. Pt holding hand held urinal.         Functional mobility during ADLs: Modified independent General ADL Comments: Mild slow ambulation; likely impacted by pt holding urinal due to difficulty  with controling urination.     Vision Baseline Vision/History: 1 Wears glasses Ability to See in Adequate Light: 0 Adequate Patient Visual Report: No change from baseline Vision Assessment?: No apparent visual deficits                Pertinent Vitals/Pain Pain Assessment: Faces Faces Pain Scale: No hurt Pain Location: L hip     Hand Dominance Left   Extremity/Trunk Assessment Upper Extremity Assessment Upper Extremity Assessment: RUE deficits/detail (WFL L UE strength.) RUE Deficits / Details: NWB in shoulder  sling. Sling adjusted to help pt maintain NWB status.   Lower Extremity Assessment Lower Extremity Assessment: Defer to PT evaluation   Cervical / Trunk Assessment Cervical / Trunk Assessment: Normal   Communication Communication Communication: No difficulties   Cognition Arousal/Alertness: Awake/alert Behavior During Therapy: WFL for tasks assessed/performed Overall Cognitive Status: Within Functional Limits for tasks assessed                                     General Comments  R LE edema            Home Living Family/patient expects to be discharged to:: Private residence Living Arrangements: Spouse/significant other Available Help at Discharge: Available 24 hours/day;Family Type of Home: House Home Access: Stairs to enter [employer] of Steps: 3 Entrance Stairs-Rails:  (center rail) Home Layout: Two level Alternate Level Stairs-Number of Steps: "flight" 12 to 13 steps Alternate Level Stairs-Rails: Left (going up) Bathroom Shower/Tub: Teacher, early years/pre: Handicapped height (slightly raised) Bathroom Accessibility: Yes   Home Equipment: Cane - single point          Prior Functioning/Environment Level of Independence: Needs assistance  Gait / Transfers Assistance Needed: Pt reports independent ambulation. ADL's / Homemaking Assistance Needed: Pt is assisted for ADL's and IADL's by wife since shoulder surgery.                          OT Goals(Current goals can be found in the care plan section) Acute Rehab OT Goals Patient Stated Goal: return home  OT Frequency:     Barriers to D/C:            Co-evaluation PT/OT/SLP Co-Evaluation/Treatment: Yes Reason for Co-Treatment: To address functional/ADL transfers   OT goals addressed during session: ADL's and self-care                       End of Session Equipment Utilized During Treatment:  (R shoulder sling)  Activity Tolerance: Patient  tolerated treatment well Patient left: in chair;with call bell/phone within reach;with family/visitor present  OT Visit Diagnosis: Unsteadiness on feet (R26.81);Muscle weakness (generalized) (M62.81)                Time: 5638-7564 OT Time Calculation (min): 19 min Charges:  OT General Charges $OT Visit: 1 Visit OT Evaluation $OT Eval Low Complexity: 1 Low  Penny Arrambide OT, MOT  Larey Seat 07/23/2021, 10:04 AM

## 2021-07-23 NOTE — Evaluation (Signed)
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  Patient had right rotator cuff surgery performed at [facility] hospital 2 weeks ago.  Patient was discharged on Lyrica, Zanaflex and oxycodone.  Since that time, the patient has been having increasing ability was stumbling and falling.  Patient fell 3 days ago while walking up the front of his stairs to his home.  Likely his wife was behind him and he fell on top of her.   Clinical Impression  Patient functioning near baseline for functional mobility and gait demonstrating good return for sit to stands, transfers and walking in room/hallways without loss of balance.  Patient encouraged to ambulate with family and nursing staff daily.  Plan:  Patient discharged from physical therapy to care of nursing for ambulation daily as tolerated for length of stay.         Recommendations for follow up therapy are one component of a multi-disciplinary discharge planning process, led by the attending physician.  Recommendations may be updated based on patient status, additional functional criteria and insurance authorization.  Follow Up Recommendations Outpatient PT;Other (comment);Follow surgeon's recommendation for DC plan and follow-up therapies (for right shoulder when restrictions lifted)    Equipment Recommendations  None recommended by PT    Recommendations for Other Services       Precautions / Restrictions Precautions Precautions: Fall Restrictions Weight Bearing Restrictions: Yes RUE Weight  Bearing: Non weight bearing      Mobility  Bed Mobility Overal bed mobility: Independent                  Transfers Overall transfer level: Modified independent               General transfer comment: Pt able to ambulate without AD. Mild slow movement likely due to pt holding urinal during ambulation/transfer.  Ambulation/Gait Ambulation/Gait assistance: Modified independent (Device/Increase time) Gait Distance (Feet): 75 Feet Assistive device: None Gait Pattern/deviations: WFL(Within Functional Limits) Gait velocity: decreased   General Gait Details: grossly WFL with good return demonstrated for ambulation in room and hallways  Stairs            Wheelchair Mobility    Modified Rankin (Stroke Patients Only)       Balance Overall balance assessment: No apparent balance deficits (not formally assessed)                                           Pertinent Vitals/Pain Pain Assessment: Faces Faces Pain Scale: Hurts little more Pain Location: right shoulder with movement, mild left hip pain Pain Descriptors / Indicators: Sore Pain Intervention(s): Limited activity within patient's tolerance;Monitored during session;Repositioned    Home Living Family/patient expects to be discharged to:: Private residence Living Arrangements: Spouse/significant other Available Help at Discharge: Available 24 hours/day;Family Type of Home: House Home Access: Stairs to enter Entrance Stairs-Rails:  (center rail) Technical brewer of Steps: 3 Home Layout: Two level Home Equipment: Cane - single point      Prior Function Level of Independence: Needs assistance  Gait / Transfers Assistance Needed: community ambulator, drives (prior to Right Total shoulder replacement)  ADL's / Homemaking Assistance Needed: assisted by spouse        Hand Dominance   Dominant Hand: Left    Extremity/Trunk Assessment   Upper Extremity Assessment Upper  Extremity Assessment: Defer to OT evaluation RUE Deficits / Details: NWB in shoulder sling. Sling adjusted to help pt maintain NWB status.    Lower Extremity Assessment Lower Extremity Assessment: Overall WFL for tasks assessed    Cervical / Trunk Assessment Cervical / Trunk Assessment: Normal  Communication   Communication: No difficulties  Cognition Arousal/Alertness: Awake/alert Behavior During Therapy: WFL for tasks assessed/performed Overall Cognitive Status: Within Functional Limits for tasks assessed                                        General Comments General comments (skin integrity, edema, etc.): R LE edema    Exercises     Assessment/Plan    PT Assessment All further PT needs can be met in the next venue of care  PT Problem List Decreased strength;Decreased activity tolerance;Decreased range of motion;Decreased mobility       PT Treatment Interventions      PT Goals (Current goals can be found in the Care Plan section)  Acute Rehab PT Goals Patient Stated Goal: return home with family to assist PT Goal Formulation: With patient/family Time For Goal Achievement: 07/23/21 Potential to Achieve Goals: Good    Frequency     Barriers to discharge        Co-evaluation PT/OT/SLP Co-Evaluation/Treatment: Yes Reason for Co-Treatment: Complexity of the patient's impairments (multi-system involvement);To address functional/ADL transfers PT goals addressed during session: Mobility/safety with mobility;Balance OT goals addressed during session: ADL's and self-care       AM-PAC PT "6 Clicks" Mobility  Outcome Measure Help needed turning from your back to your side while in a flat bed without using bedrails?: None Help needed moving from lying on your back to sitting on the side of a flat bed without using bedrails?: None Help needed moving to and from a bed to a chair (including a wheelchair)?: None Help needed standing up from a chair using  your arms (e.g., wheelchair or bedside chair)?: None Help needed to walk in hospital room?: A Little Help needed climbing 3-5 steps with a railing? : A Little 6 Click Score: 22    End of Session   Activity Tolerance: Patient tolerated treatment well Patient left: in chair;with call bell/phone within reach;with family/visitor present Nurse Communication: Mobility status      Time: 1916-6060 PT Time Calculation (min) (ACUTE ONLY): 21 min   Charges:   PT Evaluation $PT Eval Moderate Complexity: 1 Mod PT Treatments $Therapeutic Activity: 8-22 mins        12:20 PM, 07/23/21 Lonell Grandchild, MPT Physical Therapist with [facility] [phone] office [phone] mobile phone

## 2021-07-24 ENCOUNTER — Encounter (HOSPITAL_COMMUNITY)
Admission: EM | Disposition: A | Discharge status: Home / Self Care | Payer: Self-pay | Source: Home / Self Care | Attending: Family Medicine

## 2021-07-24 ENCOUNTER — Inpatient Hospital Stay (HOSPITAL_COMMUNITY): Payer: Medicare Other | Admitting: Anesthesiology

## 2021-07-24 ENCOUNTER — Encounter (HOSPITAL_COMMUNITY): Payer: Self-pay | Admitting: Family Medicine

## 2021-07-24 DIAGNOSIS — Z7901 Long term (current) use of anticoagulants: Secondary | ICD-10-CM | POA: Diagnosis not present

## 2021-07-24 DIAGNOSIS — I480 Paroxysmal atrial fibrillation: Secondary | ICD-10-CM | POA: Diagnosis not present

## 2021-07-24 DIAGNOSIS — N179 Acute kidney failure, unspecified: Secondary | ICD-10-CM | POA: Diagnosis not present

## 2021-07-24 DIAGNOSIS — I10 Essential (primary) hypertension: Secondary | ICD-10-CM | POA: Diagnosis not present

## 2021-07-24 HISTORY — PX: COLONOSCOPY WITH PROPOFOL: SHX5780

## 2021-07-24 HISTORY — PX: POLYPECTOMY: SHX5525

## 2021-07-24 LAB — CBC
HCT: 32.2 % — ABNORMAL LOW (ref 39.0–52.0)
Hemoglobin: 9.9 g/dL — ABNORMAL LOW (ref 13.0–17.0)
MCH: 30.5 pg (ref 26.0–34.0)
MCHC: 30.7 g/dL (ref 30.0–36.0)
MCV: 99.1 fL (ref 80.0–100.0)
Platelets: 125 10*3/uL — ABNORMAL LOW (ref 150–400)
RBC: 3.25 MIL/uL — ABNORMAL LOW (ref 4.22–5.81)
RDW: 14.6 % (ref 11.5–15.5)
WBC: 6 10*3/uL (ref 4.0–10.5)
nRBC: 0 % (ref 0.0–0.2)

## 2021-07-24 SURGERY — COLONOSCOPY WITH PROPOFOL
Anesthesia: General

## 2021-07-24 MED ORDER — FENTANYL CITRATE (PF) 100 MCG/2ML IJ SOLN
25.0000 ug | INTRAMUSCULAR | Status: DC | PRN
  Administered 2021-07-24: 50 ug via INTRAVENOUS

## 2021-07-24 MED ORDER — FENTANYL CITRATE (PF) 100 MCG/2ML IJ SOLN: 50.0000 ug | INTRAMUSCULAR | Status: DC | PRN

## 2021-07-24 MED ORDER — FENTANYL CITRATE (PF) 100 MCG/2ML IJ SOLN
INTRAMUSCULAR | Status: AC
  Administered 2021-07-24: 50 ug via INTRAVENOUS
  Filled 2021-07-24: qty 2

## 2021-07-24 MED ORDER — LABETALOL HCL 5 MG/ML IV SOLN: 10.0000 mg | INTRAVENOUS | Status: DC | PRN

## 2021-07-24 MED ORDER — LACTATED RINGERS IV SOLN: INTRAVENOUS | Status: DC

## 2021-07-24 MED ORDER — KETAMINE HCL 50 MG/5ML IJ SOSY
PREFILLED_SYRINGE | INTRAMUSCULAR | Status: AC
  Filled 2021-07-24: qty 5

## 2021-07-24 MED ORDER — POLYETHYLENE GLYCOL 3350 17 G PO PACK
17.0000 g | PACK | Freq: Two times a day (BID) | ORAL | Status: DC
  Filled 2021-07-24: qty 1

## 2021-07-24 MED ORDER — SODIUM POLYSTYRENE SULFONATE 15 GM/60ML PO SUSP
15.0000 g | Freq: Once | ORAL | Status: AC
  Administered 2021-07-24: 15 g via ORAL
  Filled 2021-07-24: qty 60

## 2021-07-24 MED ORDER — AMOXICILLIN-POT CLAVULANATE 500-125 MG PO TABS
1.0000 | ORAL_TABLET | Freq: Two times a day (BID) | ORAL | Status: DC
  Administered 2021-07-24 – 2021-07-25 (×2): 500 mg via ORAL
  Filled 2021-07-24 (×2): qty 1

## 2021-07-24 MED ORDER — PANTOPRAZOLE SODIUM 40 MG PO TBEC
40.0000 mg | DELAYED_RELEASE_TABLET | Freq: Every day | ORAL | Status: DC
  Administered 2021-07-24 – 2021-07-25 (×2): 40 mg via ORAL
  Filled 2021-07-24 (×2): qty 1

## 2021-07-24 MED ORDER — HYDROCORTISONE ACETATE 25 MG RE SUPP
25.0000 mg | Freq: Two times a day (BID) | RECTAL | Status: DC
  Administered 2021-07-24 – 2021-07-25 (×2): 25 mg via RECTAL
  Filled 2021-07-24 (×2): qty 1

## 2021-07-24 MED ORDER — LIDOCAINE HCL (CARDIAC) PF 100 MG/5ML IV SOSY
PREFILLED_SYRINGE | INTRAVENOUS | Status: DC | PRN
  Administered 2021-07-24: 30 mg via INTRAVENOUS

## 2021-07-24 MED ORDER — PROPOFOL 10 MG/ML IV BOLUS
INTRAVENOUS | Status: DC | PRN
Start: 2021-07-24 — End: 2021-07-24
  Administered 2021-07-24: 30 mg via INTRAVENOUS
  Administered 2021-07-24 (×4): 20 mg via INTRAVENOUS

## 2021-07-24 MED ORDER — SODIUM CHLORIDE 0.9 % IV SOLN: INTRAVENOUS | Status: DC

## 2021-07-24 MED ORDER — KETAMINE HCL 10 MG/ML IJ SOLN
INTRAMUSCULAR | Status: DC | PRN
  Administered 2021-07-24 (×2): 10 mg via INTRAVENOUS

## 2021-07-24 MED ORDER — STERILE WATER FOR IRRIGATION IR SOLN
Status: DC | PRN
  Administered 2021-07-24: 100 mL

## 2021-07-24 NOTE — Progress Notes (Signed)
Patient has required one to two person assist all day, with occasional use of weight belt to get him from bed to chair to bedside commode. He has had staff wipe and cleanse him, stating he could not do it. Patient called out around 30 mins ago, and CNA went to check on him. He had soiled his linens and thrown them off into the floor and was sitting on washclothes stating "No one came". Patient never called out. This morning, he had stated he didn't want to take the bowel prep because he sat in "stool all night". It's very well documented that patient has had multiple loose stools, throughout both shifts and was cleansed. (Had an enema and golytelty today for example). Notified Dr. Joesph Fillers, who confirmed patient is known to have periods of confusion mixed with coherent behavior. Bed alarm has been turned on for patient safety.

## 2021-07-24 NOTE — Progress Notes (Signed)
Patient Demographics:    Carlos Rodriguez, is a 75 y.o. male, DOB - 1946/01/04, OVF:[id number]  Admit date - 07/21/2021   Admitting Physician Abeer Deskins Denton Brick, MD  Outpatient Primary MD for the patient is System, Provider Not In  LOS - 3   Chief Complaint  Patient presents with   Weakness        Subjective:    [facility] today has no fevers, no emesis,  No chest pain,  -- -Tolerated colonoscopy well, -Tolerating oral intake well -Wife at bedside, questions answered  Assessment  & Plan :    Principal Problem:   Acute renal failure superimposed on stage 4 chronic kidney disease ([place]) Active Problems:   Chronic anticoagulation - on lifelong lovenox 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 while on coumadin and Xarelto   Lymphedema of right lower extremity   Paroxysmal atrial fibrillation ([place])   Stiff person syndrome   Essential hypertension   Inability to walk - likely due to Lyrica   Hyperkalemia   AKI (acute kidney injury) ([place])   Elevated LFTs   Anemia  Brief Summary:- 75 year old African-American male with a history of CKD stage IV, hypertension, history of hypercoagulable state on chronic anticoagulation, paroxysmal atrial fibrillation, 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 admitted with altered mentation and generalized weakness/falls and AKI on CKD  A/p 1)Lower Gi Bleed/stercoral colitis--patient apparently has had constipation due to opiate use for right shoulder surgery, he was straining,  colonoscopy on 07/24/2021 with inflamed rectal mucosa suggestive of stercoral colitis -IV heparin discontinued due to recurrent GI bleed -Follow H&H -May be able to restart IV  heparin on 07/25/2021 -Give Anusol HC, Augmentin and MiraLAX twice daily for stercoral colitis  2)Recurrent VTE/very hypercoagulable state----patient actually had breakthrough VTE while on Coumadin and also while on Xarelto -PTA was on Lovenox, which may need to be adjusted given worsening renal function -IV heparin on hold due to recurrent GI bleed --May be able to restart IV heparin on 07/25/2021  3)AKI----acute kidney injury on CKD stage -IV    -Baseline creatinine previously under 3 -Renal ultrasound without obstructive uropathy -On admission creatinine was 4.78 currently improving with hydration Renally adjust medications, avoid nephrotoxic agents / dehydration  / hypotension  4) hyperkalemia--- in the setting of #3 above, potassium still elevated repeat Lokelma -Anticipate potassium will improve with GoLytely induced diarrhea as patient drinks his colon prep -Repeat BMP in a.m.  5) acute on chronic anemia--- hemoglobin currently around 10, monitor closely given rectal bleeding concerns  6)PAFib--anticoagulation as above #2  7) chronic right lower extremity lymphedema--secondary to prior osteosarcoma surgery with lymph node dissection -Supportive care patient has a lymphedema pump at home  8)HTN--- losartan on hold due to AKI, continue amlodipine  --okay to use IV labetalol as needed elevated BP  9) generalized weakness and deconditioning/ambulatory dysfunction and falls--- multifactorial, patient was recently started on Lyrica and oxycodone in the setting of worsening renal function and has been weak, somewhat confused at times and falling -physical therapy and occupational therapy eval appreciated recommends outpatient versus home health PT  Disposition/Need for in-Hospital Stay- patient unable to be discharged at this time due to --- AKI requiring IV fluids ,rectal bleeding requiring endoluminal evaluation, recurrent DVTs requiring  anticoagulation if no further GI  bleed  Status is: Inpatient  Remains inpatient appropriate because: Please see disposition above  Disposition: The patient is from: Home              Anticipated d/c is to: Home              Anticipated d/c date is: 1 day              Patient currently is not medically stable to d/c. Barriers: Not Clinically Stable-   Code Status :  -  Code Status: Full Code   Family Communication:  (patient is alert, awake and coherent)  Discussed with wife  Consults  :  Gi/Nephrology  DVT Prophylaxis  :   - SCDs   Foot Pump / plexipulse Start: 07/21/21 2041    Lab Results  Component Value Date   PLT 125 (L) 07/24/2021    Inpatient Medications  Scheduled Meds:  amLODipine  5 mg Oral Daily   hydrocortisone  25 mg Rectal BID   mirabegron ER  50 mg Oral Daily   pantoprazole (PROTONIX) IV  40 mg Intravenous Q12H   polyethylene glycol  17 g Oral BID   Continuous Infusions:   PRN Meds:.acetaminophen **OR** acetaminophen, melatonin, ondansetron **OR** ondansetron (ZOFRAN) IV, oxyCODONE    Anti-infectives (From admission, onward)    None         Objective:   Vitals:   07/24/21 1249 07/24/21 1337 07/24/21 1345 07/24/21 1400  BP: (!) 147/77 117/75 125/90 132/84  Pulse: 64  64   Resp: 16  12 17   Temp: 98.2 F (36.8 C) 97.6 F (36.4 C)    TempSrc: Oral     SpO2: 100%  100% 100%  Weight:      Height:        Wt Readings from Last 3 Encounters:  07/24/21 96.7 kg     Intake/Output Summary (Last 24 hours) at 07/24/2021 1636 Last data filed at 07/24/2021 1500 Gross per 24 hour  Intake 2591.08 ml  Output 1425 ml  Net 1166.08 ml    Physical Exam  Gen:- Awake Alert, in no acute distress HEENT:- [place].AT, No sclera icterus Neck-Supple Neck,No JVD,.  Lungs-  CTAB , fair symmetrical air movement CV- S1, S2 normal, regular  Abd-  +ve B.Sounds, Abd Soft, No tenderness,    Extremity/Skin:-Significant lymphedema of the right lower extremity  --pedal pulses present   Psych-affect is appropriate, oriented x3 Neuro-no new focal deficits, no tremors MSK-right shoulder sling due to recent surgery of the rotator cuff   Data Review:   Micro Results Recent Results (from the past 240 hour(s))  Resp Panel by RT-PCR (Flu A&B, Covid) Nasopharyngeal Swab     Status: None   Collection Time: 07/22/21  3:22 AM   Specimen: Nasopharyngeal Swab; Nasopharyngeal(NP) swabs in vial transport medium  Result Value Ref Range Status   SARS Coronavirus 2 by RT PCR NEGATIVE NEGATIVE Final    Comment: (NOTE) SARS-CoV-2 target nucleic acids are NOT DETECTED.  The SARS-CoV-2 RNA is generally detectable in upper respiratory specimens during the acute phase of infection. The lowest concentration of SARS-CoV-2 viral copies this assay can detect is 138 copies/mL. A negative result does not preclude SARS-Cov-2 infection and should not be used as the sole basis for treatment or other patient  management decisions. A negative result may occur with  improper specimen collection/handling, submission of specimen other than nasopharyngeal swab, presence of viral mutation(s) within the areas targeted by this assay, and inadequate number of viral copies(<138 copies/mL). A negative result must be combined with clinical observations, patient history, and epidemiological information. The expected result is Negative.  Fact Sheet for Patients:  EntrepreneurPulse.com.au  Fact Sheet for Healthcare Providers:  IncredibleEmployment.be  This test is no t yet approved or cleared by the [place] FDA and  has been authorized for detection and/or diagnosis of SARS-CoV-2 by FDA under an Emergency Use Authorization (EUA). This EUA will remain  in effect (meaning this test can be used) for the duration of the COVID-19 declaration under Section 564(b)(1) of the Act, 21 U.S.C.section 360bbb-3(b)(1), unless the authorization is terminated  or revoked sooner.        Influenza A by PCR NEGATIVE NEGATIVE Final   Influenza B by PCR NEGATIVE NEGATIVE Final    Comment: (NOTE) The Xpert Xpress SARS-CoV-2/FLU/RSV plus assay is intended as an aid in the diagnosis of influenza from Nasopharyngeal swab specimens and should not be used as a sole basis for treatment. Nasal washings and aspirates are unacceptable for Xpert Xpress SARS-CoV-2/FLU/RSV testing.  Fact Sheet for Patients: EntrepreneurPulse.com.au  Fact Sheet for Healthcare Providers: IncredibleEmployment.be  This test is not yet approved or cleared by the [place] FDA and has been authorized for detection and/or diagnosis of SARS-CoV-2 by FDA under an Emergency Use Authorization (EUA). This EUA will remain in effect (meaning this test can be used) for the duration of the COVID-19 declaration under Section 564(b)(1) of the Act, 21 U.S.C. section 360bbb-3(b)(1), unless the authorization is terminated or revoked.  Performed at [facility], [address] [place], [place] [address]     Radiology Reports DG Lumbar Spine Complete  Result Date: 07/21/2021 CLINICAL DATA:  Status post fall. EXAM: LUMBAR SPINE - COMPLETE 4+ VIEW COMPARISON:  June 18, 2021 FINDINGS: There is no evidence of lumbar spine fracture. Alignment is normal. Marked severity endplate sclerosis is seen at the levels of L3-L4, L4-L5 and L5-S1. Moderate to marked severity intervertebral disc space narrowing is also seen at these levels. Radiopaque surgical clips are seen within the right upper quadrant. Inferior vena cava filter is noted. IMPRESSION: 1. No acute findings in the lumbar spine. 2. 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 Electronically Signed   By: Virgina Norfolk M.D.   On: 07/21/2021 16:19   CT Head Wo Contrast  Result Date: 07/21/2021 CLINICAL DATA:  Mental status change. EXAM: CT HEAD WITHOUT CONTRAST TECHNIQUE: Contiguous axial images were  obtained from the base of the skull through the vertex without intravenous contrast. COMPARISON:  None. BRAIN: BRAIN Cerebral ventricle sizes are concordant with the degree of cerebral volume loss. Cerebral ventricle sizes are concordant with the degree of cerebral volume loss. Patchy and confluent areas of decreased attenuation are noted throughout the deep and periventricular white matter of the cerebral hemispheres bilaterally, compatible with chronic microvascular ischemic disease. No evidence of large-territorial acute infarction. No parenchymal hemorrhage. No mass lesion. No extra-axial collection. No mass effect or midline shift. No hydrocephalus. Basilar cisterns are patent. Vascular: No hyperdense vessel. Atherosclerotic calcifications are present within the cavernous internal carotid arteries. Skull: No acute fracture or focal lesion. Sinuses/Orbits: Paranasal sinuses and mastoid air cells are clear. Bilateral lens replacement. Orbits are unremarkable. Other: None. IMPRESSION: No acute intracranial abnormality. Electronically Signed   By: Thomasena Edis  Mckinley Jewel M.D.   On: 07/21/2021 16:31   US RENAL  Result Date: 07/22/2021 CLINICAL DATA:  AKI EXAM: RENAL / URINARY TRACT ULTRASOUND COMPLETE COMPARISON:  None. FINDINGS: Evaluation of the kidneys is limited due to significant overlying bowel gas and patient immobility due to recent surgery. Right Kidney: The right lower pole is not well evaluated. Approximate renal measurements: 6.4 cm x 4.7 cm x 4.8 cm = volume: 76 cc mL. Echogenicity within normal limits. No definite mass or hydronephrosis visualized. Left Kidney: Approximate renal measurements: 8.5 cm x 5.0 cm x 5.6 cm = volume: 123 mL. Echogenicity within normal limits. No mass or hydronephrosis visualized. Bladder: Appears normal for degree of bladder distention. Both ureteral jets were seen. Other: None. IMPRESSION: 1. Evaluation of the kidneys is difficult due to significant overlying bowel gas and  patient immobility. Within these confines, no abnormality was identified. No hydronephrosis. 2. Normal bladder with both ureteral jets identified. Electronically Signed   By: Valetta Mole M.D.   On: 07/22/2021 08:50   DG Hip Unilat W or Wo Pelvis 2-3 Views Left  Result Date: 07/21/2021 CLINICAL DATA:  Status post fall 2 days ago. EXAM: DG HIP (WITH OR WITHOUT PELVIS) 2-3V LEFT COMPARISON:  June 18, 2021 FINDINGS: There is no evidence of hip fracture or dislocation. There is no evidence of arthropathy or other focal bone abnormality. IMPRESSION: Negative. Electronically Signed   By: Virgina Norfolk M.D.   On: 07/21/2021 16:18     CBC Recent Labs  Lab 07/21/21 1505 07/22/21 0646 07/22/21 1442 07/23/21 0848 07/24/21 0719  WBC 6.9 6.0  --  5.2 6.0  HGB 10.0* 9.2* 10.2* 9.3* 9.9*  HCT 32.1* 29.0* 33.2* 29.4* 32.2*  PLT 151 131*  --  138* 125*  MCV 97.3 94.8  --  95.5 99.1  MCH 30.3 30.1  --  30.2 30.5  MCHC 31.2 31.7  --  31.6 30.7  RDW 14.9 14.7  --  14.6 14.6  LYMPHSABS 0.9 1.1  --   --   --   MONOABS 0.6 0.6  --   --   --   EOSABS 0.2 0.2  --   --   --   BASOSABS 0.0 0.0  --   --   --     Chemistries  Recent Labs  Lab 07/21/21 1505 07/22/21 0646 07/23/21 0647  NA 136 136 139  K 5.4* 5.6* 5.3*  CL 109 113* 115*  CO2 21* 17* 19*  GLUCOSE 103* 107* 83  BUN 63* 60* 55*  CREATININE 4.78* 4.21* 3.71*  CALCIUM 9.3 8.9 9.1  MG  --  2.1  --   AST 77* 51* 40  ALT 34 27 22  ALKPHOS 180* 156* 151*  BILITOT 0.8 0.7 0.8   ------------------------------------------------------------------------------------------------------------------ No results for input(s): CHOL, HDL, LDLCALC, TRIG, CHOLHDL, LDLDIRECT in the last 72 hours.  No results found for: HGBA1C ------------------------------------------------------------------------------------------------------------------ No results for input(s): TSH, T4TOTAL, T3FREE, THYROIDAB in the last 72 hours.  Invalid input(s):  FREET3 ------------------------------------------------------------------------------------------------------------------ No results for input(s): VITAMINB12, FOLATE, FERRITIN, TIBC, IRON, RETICCTPCT in the last 72 hours.  Coagulation profile No results for input(s): INR, PROTIME in the last 168 hours.  No results for input(s): DDIMER in the last 72 hours.  Cardiac Enzymes No results for input(s): CKMB, TROPONINI, MYOGLOBIN in the last 168 hours.  Invalid input(s): CK ------------------------------------------------------------------------------------------------------------------ No results found for: BNP   Roxan Hockey M.D on 07/24/2021 at 4:36 PM  Go to [email or web address] - for contact  info  [facility] - Office  [phone]

## 2021-07-24 NOTE — Transfer of Care (Signed)
Immediate Anesthesia Transfer of Care Note  Patient: Carlos Rodriguez  Procedure(s) Performed: COLONOSCOPY WITH PROPOFOL POLYPECTOMY  Patient Location: PACU  Anesthesia Type:General  Level of Consciousness: awake, alert , oriented and patient cooperative  Airway & Oxygen Therapy: Patient Spontanous Breathing  Post-op Assessment: Report given to RN, Post -op Vital signs reviewed and stable and Patient moving all extremities X 4  Post vital signs: Reviewed and stable  Last Vitals:  Vitals Value Taken Time  BP    Temp    Pulse 63 07/24/21 1333  Resp 14 07/24/21 1333  SpO2 97 % 07/24/21 1333  Vitals shown include unvalidated device data.  Last Pain:  Vitals:   07/24/21 1325  TempSrc:   PainSc: 0-No pain         Complications: No notable events documented.

## 2021-07-24 NOTE — Interval H&P Note (Signed)
History and Physical Interval Note:  07/24/2021 1:08 PM  Carlos Rodriguez  has presented today for surgery, with the diagnosis of rectal bleeding.  The various methods of treatment have been discussed with the patient and family. After consideration of risks, benefits and other options for treatment, the patient has consented to  Procedure(s): COLONOSCOPY WITH PROPOFOL (N/A) as a surgical intervention.  The patient's history has been reviewed, patient examined, no change in status, stable for surgery.  I have reviewed the patient's chart and labs.  Questions were answered to the patient's satisfaction.     Eloise Harman

## 2021-07-24 NOTE — Anesthesia Preprocedure Evaluation (Signed)
Anesthesia Evaluation  Patient identified by MRN, date of birth, ID band Patient awake    Reviewed: Allergy & Precautions, NPO status , Patient's Chart, lab work & pertinent test results  Airway Mallampati: II  TM Distance: >3 FB Neck ROM: Full    Dental  (+) Dental Advisory Given   Pulmonary asthma , sleep apnea ,    Pulmonary exam normal breath sounds clear to auscultation       Cardiovascular hypertension, Pt. on medications Normal cardiovascular exam Rhythm:Regular Rate:Normal     Neuro/Psych  Neuromuscular disease CVA, Residual Symptoms    GI/Hepatic negative GI ROS, Neg liver ROS,   Endo/Other  negative endocrine ROS  Renal/GU ESRF and Renal InsufficiencyRenal disease     Musculoskeletal negative musculoskeletal ROS (+)   Abdominal   Peds  Hematology  (+) anemia ,   Anesthesia Other Findings   Reproductive/Obstetrics negative OB ROS                             Anesthesia Physical Anesthesia Plan  ASA: 4  Anesthesia Plan: General   Post-op Pain Management:    Induction: Intravenous  PONV Risk Score and Plan: TIVA  Airway Management Planned: Nasal Cannula and Natural Airway  Additional Equipment:   Intra-op Plan:   Post-operative Plan:   Informed Consent: I have reviewed the patients History and Physical, chart, labs and discussed the procedure including the risks, benefits and alternatives for the proposed anesthesia with the patient or authorized representative who has indicated his/her understanding and acceptance.     Dental advisory given  Plan Discussed with: CRNA and Surgeon  Anesthesia Plan Comments:         Anesthesia Quick Evaluation

## 2021-07-24 NOTE — Anesthesia Postprocedure Evaluation (Signed)
Anesthesia Post Note  Patient: Carlos Rodriguez  Procedure(s) Performed: COLONOSCOPY WITH PROPOFOL POLYPECTOMY  Patient location during evaluation: PACU Anesthesia Type: General Level of consciousness: awake and alert Pain management: pain level controlled Vital Signs Assessment: post-procedure vital signs reviewed and stable Respiratory status: spontaneous breathing, nonlabored ventilation and respiratory function stable Cardiovascular status: blood pressure returned to baseline and stable Postop Assessment: no apparent nausea or vomiting Anesthetic complications: no   No notable events documented.   Last Vitals:  Vitals:   07/24/21 1345 07/24/21 1400  BP: 125/90 132/84  Pulse: 64   Resp: 12 17  Temp:    SpO2: 100% 100%    Last Pain:  Vitals:   07/24/21 1405  TempSrc:   PainSc: 6                  Edin Skarda C Mery Guadalupe

## 2021-07-24 NOTE — Progress Notes (Signed)
I spoke with [name], MD because pt refused to drink more than half of his bowel prep. Dulcolax ordered and given. Tech stated that last BM was liquid with brown tinged. In addition, pt refused Tap water enema. Pt also had nurse message hospitalist because he thinks he isn't getting the care he needs for his right shoulder. Pt informed that we are taking precautions for this right shoulder. And that physicians are aware of his medical problems.

## 2021-07-25 ENCOUNTER — Telehealth: Payer: Self-pay | Admitting: Gastroenterology

## 2021-07-25 ENCOUNTER — Encounter (HOSPITAL_COMMUNITY): Payer: Self-pay | Admitting: Internal Medicine

## 2021-07-25 DIAGNOSIS — D649 Anemia, unspecified: Secondary | ICD-10-CM

## 2021-07-25 LAB — CBC
HCT: 26.8 % — ABNORMAL LOW (ref 39.0–52.0)
Hemoglobin: 8.5 g/dL — ABNORMAL LOW (ref 13.0–17.0)
MCH: 30 pg (ref 26.0–34.0)
MCHC: 31.7 g/dL (ref 30.0–36.0)
MCV: 94.7 fL (ref 80.0–100.0)
Platelets: 124 10*3/uL — ABNORMAL LOW (ref 150–400)
RBC: 2.83 MIL/uL — ABNORMAL LOW (ref 4.22–5.81)
RDW: 14.2 % (ref 11.5–15.5)
WBC: 4.4 10*3/uL (ref 4.0–10.5)
nRBC: 0 % (ref 0.0–0.2)

## 2021-07-25 LAB — BASIC METABOLIC PANEL
Anion gap: 6 (ref 5–15)
BUN: 42 mg/dL — ABNORMAL HIGH (ref 8–23)
CO2: 17 mmol/L — ABNORMAL LOW (ref 22–32)
Calcium: 9 mg/dL (ref 8.9–10.3)
Chloride: 116 mmol/L — ABNORMAL HIGH (ref 98–111)
Creatinine, Ser: 3.16 mg/dL — ABNORMAL HIGH (ref 0.61–1.24)
GFR, Estimated: 20 mL/min — ABNORMAL LOW (ref >60)
Glucose, Bld: 84 mg/dL (ref 70–99)
Potassium: 5.1 mmol/L (ref 3.5–5.1)
Sodium: 139 mmol/L (ref 135–145)

## 2021-07-25 MED ORDER — ACETAMINOPHEN 325 MG PO TABS: 650.0000 mg | ORAL_TABLET | Freq: Four times a day (QID) | ORAL | 0 refills | Status: AC | PRN

## 2021-07-25 MED ORDER — AMOXICILLIN-POT CLAVULANATE 500-125 MG PO TABS: 1.0000 | ORAL_TABLET | Freq: Two times a day (BID) | ORAL | 0 refills | Status: AC

## 2021-07-25 MED ORDER — OMEPRAZOLE 20 MG PO CPDR: 20.0000 mg | DELAYED_RELEASE_CAPSULE | Freq: Every day | ORAL | 2 refills | Status: DC

## 2021-07-25 MED ORDER — SODIUM POLYSTYRENE SULFONATE 15 GM/60ML PO SUSP
15.0000 g | Freq: Once | ORAL | Status: AC
  Administered 2021-07-25: 15 g via ORAL
  Filled 2021-07-25: qty 60

## 2021-07-25 MED ORDER — HYDROCORTISONE ACETATE 25 MG RE SUPP: 25.0000 mg | Freq: Two times a day (BID) | RECTAL | 0 refills | Status: AC

## 2021-07-25 MED ORDER — AMLODIPINE BESYLATE 5 MG PO TABS: 5.0000 mg | ORAL_TABLET | Freq: Every day | ORAL | 1 refills | Status: AC

## 2021-07-25 MED ORDER — POLYETHYLENE GLYCOL 3350 17 G PO PACK: 17.0000 g | PACK | Freq: Two times a day (BID) | ORAL | 2 refills | Status: DC

## 2021-07-25 MED ORDER — METHOCARBAMOL 500 MG PO TABS: 500.0000 mg | ORAL_TABLET | Freq: Three times a day (TID) | ORAL | 0 refills | Status: DC

## 2021-07-25 NOTE — Op Note (Signed)
[facility] Patient Name: Carlos Rodriguez Procedure Date: 07/24/2021 1:14 PM MRN: [id number] Date of Birth: 1946-03-17 Attending MD: Elon Alas. Abbey Chatters DO CSN: [id number] Age: 75 Admit Type: Inpatient Procedure:                Colonoscopy Indications:              Hematochezia Providers:                Elon Alas. Abbey Chatters, DO, Tammy Vaught, RN, Nelma Rothman,                            Technician Referring MD:              Medicines:                See the Anesthesia note for documentation of the                            administered medications Complications:            No immediate complications. Estimated Blood Loss:     Estimated blood loss was minimal. Procedure:                Pre-Anesthesia Assessment:                           - The anesthesia plan was to use monitored                            anesthesia care (MAC).                           After obtaining informed consent, the colonoscope                            was passed under direct vision. Throughout the                            procedure, the patient's blood pressure, pulse, and                            oxygen saturations were monitored continuously. The                            [id number]-HQ190L ([id number]) scope was introduced through                            the anus and advanced to the the cecum, identified                            by appendiceal orifice and ileocecal valve. The                            colonoscopy was performed without difficulty. The                            patient tolerated the procedure well. The quality  of the bowel preparation was evaluated using the                            BBPS [facility] Bowel Preparation Scale) with scores                            of: Right Colon = 3, Transverse Colon = 3 and Left                            Colon = 3 (entire mucosa seen well with no residual                            staining, small fragments of stool or opaque                             liquid). The total BBPS score equals 9. Scope In: 1:13:48 PM Scope Out: 1:28:47 PM Scope Withdrawal Time: 0 hours 8 minutes 14 seconds  Total Procedure Duration: 0 hours 14 minutes 59 seconds  Findings:      The perianal and digital rectal examinations were normal.      Non-bleeding internal hemorrhoids were found during endoscopy.      A localized area of moderately congested, erythematous and inflamed       mucosa was found in the rectum.      A 6 mm polyp was found in the transverse colon. The polyp was sessile.       The polyp was removed with a cold snare. Resection and retrieval were       complete.      The exam was otherwise without abnormality. Impression:               - Non-bleeding internal hemorrhoids.                           - Congested, erythematous and inflamed mucosa in                            the rectum.                           - One 6 mm polyp in the transverse colon, removed                            with a cold snare. Resected and retrieved.                           - The examination was otherwise normal. Moderate Sedation:      Per Anesthesia Care Recommendation:           - Return patient to hospital ward for ongoing care.                           - Advance diet as tolerated.                           - Rectal inflammation and hyperemia likely due to  stercoral colitis in the setting of recent opioid                            use and constipation. Recommend bowel regimen to                            keep from straining, as well as keeping bowel                            movements soft. We can add hydrocortisone                            suppositories to help with symptomatic relief.                            Repeat colonoscopy in 5 years for surveillance                            purposes. Procedure Code(s):        --- Professional ---                           [phone], Colonoscopy, flexible;  with removal of                            tumor(s), polyp(s), or other lesion(s) by snare                            technique Diagnosis Code(s):        --- Professional ---                           K62.89, Other specified diseases of anus and rectum                           K63.5, Polyp of colon                           K64.8, Other hemorrhoids                           K92.1, Melena (includes Hematochezia) CPT copyright 2019 American Medical Association. All rights reserved. The codes documented in this report are preliminary and upon coder review may  be revised to meet current compliance requirements. Elon Alas. Abbey Chatters, DO [place] Abbey Chatters, DO 07/25/2021 7:57:05 AM This report has been signed electronically. Number of Addenda: 0

## 2021-07-25 NOTE — Care Management Important Message (Signed)
Important Message  Patient Details  Name: Carlos Rodriguez MRN: [id number] Date of Birth: 1945/12/14   Medicare Important Message Given:  Yes     Tommy Medal 07/25/2021, 11:15 AM

## 2021-07-25 NOTE — Progress Notes (Signed)
Subjective:  No further bleeding. Patient is interested in going home.   Objective: Vital signs in last 24 hours: Temp:  [97.6 F (36.4 C)-98.2 F (36.8 C)] 98 F (36.7 C) (10/14 0516) Pulse Rate:  [63-69] 69 (10/14 0516) Resp:  [12-20] 20 (10/14 0516) BP: (117-147)/(75-90) 129/77 (10/14 0516) SpO2:  [93 %-100 %] 100 % (10/14 0516) Weight:  [101.6 kg] 101.6 kg (10/14 0500) Last BM Date: 07/24/21 General:   Alert,  Well-developed, well-nourished, pleasant and cooperative in NAD Head:  Normocephalic and atraumatic. Eyes:  Sclera clear, no icterus.  Abdomen:  Soft, nontender and nondistended.  Normal bowel sounds, without guarding, and without rebound.   Extremities:  right lower extremity with lymphedema. Neurologic:  Alert and  oriented x4;  grossly normal neurologically. Skin:  Intact without significant lesions or rashes. Psych:  Alert and cooperative. Normal mood and affect.  Intake/Output from previous day: 10/13 0701 - 10/14 0700 In: 452.3 [P.O.:50; I.V.:402.3] Out: 1025 [Urine:1025] Intake/Output this shift: Total I/O In: 240 [P.O.:240] Out: -   Lab Results: CBC Recent Labs    07/23/21 0848 07/24/21 0719 07/25/21 0537  WBC 5.2 6.0 4.4  HGB 9.3* 9.9* 8.5*  HCT 29.4* 32.2* 26.8*  MCV 95.5 99.1 94.7  PLT 138* 125* 124*   BMET Recent Labs    07/23/21 0647 07/25/21 0537  NA 139 139  K 5.3* 5.1  CL 115* 116*  CO2 19* 17*  GLUCOSE 83 84  BUN 55* 42*  CREATININE 3.71* 3.16*  CALCIUM 9.1 9.0   LFTs Recent Labs    07/23/21 0647  BILITOT 0.8  BILIDIR 0.2  IBILI 0.6  ALKPHOS 151*  AST 40  ALT 22  PROT 6.2*  ALBUMIN 2.6*  2.5*   No results for input(s): LIPASE in the last 72 hours. PT/INR No results for input(s): LABPROT, INR in the last 72 hours.    Imaging Studies: DG Lumbar Spine Complete  Result Date: 07/21/2021 CLINICAL DATA:  Status post fall. EXAM: LUMBAR SPINE - COMPLETE 4+ VIEW COMPARISON:  June 18, 2021 FINDINGS: There is no  evidence of lumbar spine fracture. Alignment is normal. Marked severity endplate sclerosis is seen at the levels of L3-L4, L4-L5 and L5-S1. Moderate to marked severity intervertebral disc space narrowing is also seen at these levels. Radiopaque surgical clips are seen within the right upper quadrant. Inferior vena cava filter is noted. IMPRESSION: 1. No acute findings in the lumbar spine. 2. 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 Electronically Signed   By: Virgina Norfolk M.D.   On: 07/21/2021 16:19   CT Head Wo Contrast  Result Date: 07/21/2021 CLINICAL DATA:  Mental status change. EXAM: CT HEAD WITHOUT CONTRAST TECHNIQUE: Contiguous axial images were obtained from the base of the skull through the vertex without intravenous contrast. COMPARISON:  None. BRAIN: BRAIN Cerebral ventricle sizes are concordant with the degree of cerebral volume loss. Cerebral ventricle sizes are concordant with the degree of cerebral volume loss. Patchy and confluent areas of decreased attenuation are noted throughout the deep and periventricular white matter of the cerebral hemispheres bilaterally, compatible with chronic microvascular ischemic disease. No evidence of large-territorial acute infarction. No parenchymal hemorrhage. No mass lesion. No extra-axial collection. No mass effect or midline shift. No hydrocephalus. Basilar cisterns are patent. Vascular: No hyperdense vessel. Atherosclerotic calcifications are present within the cavernous internal carotid arteries. Skull: No acute fracture or focal lesion. Sinuses/Orbits: Paranasal sinuses and mastoid air cells are clear. Bilateral lens replacement. Orbits are  unremarkable. Other: None. IMPRESSION: No acute intracranial abnormality. Electronically Signed   By: Iven Finn M.D.   On: 07/21/2021 16:31   US RENAL  Result Date: 07/22/2021 CLINICAL DATA:  AKI EXAM: RENAL / URINARY TRACT ULTRASOUND COMPLETE COMPARISON:  None. FINDINGS: Evaluation  of the kidneys is limited due to significant overlying bowel gas and patient immobility due to recent surgery. Right Kidney: The right lower pole is not well evaluated. Approximate renal measurements: 6.4 cm x 4.7 cm x 4.8 cm = volume: 76 cc mL. Echogenicity within normal limits. No definite mass or hydronephrosis visualized. Left Kidney: Approximate renal measurements: 8.5 cm x 5.0 cm x 5.6 cm = volume: 123 mL. Echogenicity within normal limits. No mass or hydronephrosis visualized. Bladder: Appears normal for degree of bladder distention. Both ureteral jets were seen. Other: None. IMPRESSION: 1. Evaluation of the kidneys is difficult due to significant overlying bowel gas and patient immobility. Within these confines, no abnormality was identified. No hydronephrosis. 2. Normal bladder with both ureteral jets identified. Electronically Signed   By: Valetta Mole M.D.   On: 07/22/2021 08:50   DG Hip Unilat W or Wo Pelvis 2-3 Views Left  Result Date: 07/21/2021 CLINICAL DATA:  Status post fall 2 days ago. EXAM: DG HIP (WITH OR WITHOUT PELVIS) 2-3V LEFT COMPARISON:  June 18, 2021 FINDINGS: There is no evidence of hip fracture or dislocation. There is no evidence of arthropathy or other focal bone abnormality. IMPRESSION: Negative. Electronically Signed   By: Virgina Norfolk M.D.   On: 07/21/2021 16:18  [2 weeks]   Assessment: 75 y.o. 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 Presenting this admission with falls, worsening ambulation, mental status changes with confusion. Admitted with acute on chronic renal injury,  progressive weakness and confusion. Felt that Lyrica may be playing a role. GI consulted due to new onset rectal bleeding in setting of Heparin infusion.  Colonoscopy yesterday with nonbleeding internal hemorrhoids, congestion/erythematous/inflamed mucosa in the rectum, 6 mm polyp in the transverse colon removed.  Findings felt to be due to stercoral colitis in the setting of recent opioid use and constipation.   Plan: Follow-up pending path. MiraLAX 17 g twice daily for now.  We will need to adjust dose according to his response.  May need once daily or every other day dosing.  Discussed at length with patient. Outpatient follow-up of abnormal LFTs.  Laureen Ochs. Bernarda Caffey [facility] [phone] 10/14/202212:20 PM    LOS: 4 days

## 2021-07-25 NOTE — Discharge Summary (Signed)
Carlos Rodriguez, is a 75 y.o. male  DOB Jan 27, 1946  MRN [id number].  Admission date:  07/21/2021  Admitting Physician  Roxan Hockey, MD  Discharge Date:  07/25/2021   Primary MD  System, Provider Not In  Recommendations for primary care physician for things to follow:   1)Avoid ibuprofen/Advil/Aleve/Motrin/Goody Powders/Naproxen/BC powders/Meloxicam/Diclofenac/Indomethacin and other Nonsteroidal anti-inflammatory medications as these will make you more likely to bleed and can cause stomach ulcers, can also cause Kidney problems.   2)Resume Lovenox/Enoxaparin blood thinner 60 mg twice daily--- please hold Lovenox if any concerns about  Bleeding  3)Please hold losartan and Lasix/furosemide until you see your kidney doctor on 07/28/2021 for repeat blood work  4)Please keep your appointment with a nephrologist/kidney doctor on 07/28/2021--- you will need a repeat CBC and repeat BMP blood work at that visit  5)Outpatient physical therapy as scheduled, continue short distance mobility with assistance/walking device at home   Admission Diagnosis  Hyperkalemia [E87.5] Weakness [R53.1] AKI (acute kidney injury) ([place]) [N17.9] Acute renal failure, unspecified acute renal failure type ([place]) [N17.9] Acute renal failure superimposed on stage 4 chronic kidney disease ([place]) [N17.9, N18.4]   Discharge Diagnosis  Hyperkalemia [E87.5] Weakness [R53.1] AKI (acute kidney injury) ([place]) [N17.9] Acute renal failure, unspecified acute renal failure type ([place]) [N17.9] Acute renal failure superimposed on stage 4 chronic kidney disease ([place]) [N17.9, N18.4]    Principal Problem:   Acute renal failure superimposed on stage 4 chronic kidney disease (HCC) Active Problems:   Chronic anticoagulation - on lifelong lovenox 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 while on coumadin and Xarelto   Lymphedema of right lower extremity   Paroxysmal  atrial fibrillation (HCC)   Stiff person syndrome   Essential hypertension   Inability to walk - likely due to Lyrica   Hyperkalemia   AKI (acute kidney injury) ([place])   Elevated LFTs   Anemia      Past Medical History:  Diagnosis Date   Asthma    CKD (chronic kidney disease)    HTN (hypertension)    Personal history of DVT (deep vein thrombosis)    and PE   Prostate cancer (HCC)    Sarcoidosis    Stiffman syndrome     Past Surgical History:  Procedure Laterality Date   CARDIAC SURGERY     right rotator cuff     ROBOT ASSISTED LAPAROSCOPIC RADICAL PROSTATECTOMY     SHOULDER SURGERY     Right shoulder   TUMOR REMOVAL       HPI  from the history and physical done on the day of admission:    CC: weakness HPI: 75 year old African-American male with a history of CKD stage IV, hypertension, history of hypercoagulable state on chronic anticoagulation, paroxysmal atrial fibrillation, 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  Patient had right rotator cuff surgery performed at [facility] hospital 2 weeks ago.  Patient was discharged on Lyrica, Zanaflex and  oxycodone.  Since that time, the patient has been having increasing ability was stumbling and falling.  Patient fell 3 days ago while walking up the front of his stairs to his home.  Likely his wife was behind him and he fell on top of her.   Patient has known CKD stage IV with a baseline creatinine approximately 2.8-3.2.  He has been followed extensively at [facility].  Patient recently moved to [facility] from [place].  He is in the ER with his wife [name].  [name] states that she has been having increasing difficulty getting the patient up and about.  He was normally walking and driving without any assistive devices.  Over the last 3 days, since his fall, she has had to help  him get out of bed.  She even had a call EMS when he fell in the house as she was unable to pick them up.  On arrival to the ER today, blood pressure 101/62, heart rate 73, temperature 98.1.   Labs show white count 6.9, hemoglobin 10.0, sodium 136, potassium 5.4, BUN of 63, creatinine 4.78, CO2 of 21, glucose 103.  Urinalysis specific gravity 1.014  CT head without acute intracranial abnormality.   Lumbar spine series demonstrated advanced degenerative disc disease L3-4, L4-5, L5-S1   Right hip x-ray negative for fracture dislocation.   Patient states that he has been drinking fluids well over the last week.  Wife states he will drink for 5 bottles of water along with a bottle of soda and it may be the bottle of Gatorade as well.  Appetites been normal.  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   Due to the patient's acute renal failure and inability to walk, [facility] contacted for admission.    ED Course: pt noted to have AKI on CKD stage 4. Started on IVF.     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, history of prostate cancer status post prior posterior resection with subsequent radiation therapy, recent right rotator cuff surgery at [facility] in [facility] admitted with altered mentation and generalized weakness/falls and AKI on CKD   A/p 1)Lower Gi Bleed/stercoral colitis--patient apparently has had constipation due to opiate use for right shoulder surgery, he was straining,  colonoscopy on 07/24/2021 with inflamed rectal mucosa suggestive of stercoral colitis --c/n  Anusol HC, Augmentin and MiraLAX twice daily for stercoral colitis -No further bleeding on IV heparin okay to  discharge home on PTA Lovenox   2)Recurrent VTE/Very hypercoagulable state----patient actually had breakthrough VTE while on Coumadin and also while on Xarelto No further bleeding on IV heparin okay to discharge home on PTA Lovenox    3)AKI----acute kidney injury on CKD stage -IV    -Baseline creatinine previously under 3 -As per care everywhere data creatinine was 2.6 in March 2022 and 2.5 in June 2022 -Renal ultrasound without obstructive uropathy -On admission creatinine was 4.78 currently improving with hydration -Creatinine has improved and discharge creatinine  is 3.1 Renally adjust medications, avoid nephrotoxic agents / dehydration  / hypotension   4) hyperkalemia--- in the setting of #3 above, improved after Lokelma   5) acute on chronic anemia--- hemoglobin currently above 8 repeat CBC as advised as outpatient   6)PAFib--anticoagulation as above #2  7) chronic right lower extremity lymphedema--secondary to prior osteosarcoma surgery with lymph node dissection -Supportive care patient has a lymphedema pump at home   8)HTN--- okay to resume Lasix and amlodipine  9) generalized weakness and deconditioning/ambulatory dysfunction and falls--- multifactorial, patient was recently started on Lyrica and oxycodone in the setting of worsening renal function and has been weak, somewhat confused at times and falling -physical therapy and occupational therapy eval appreciated recommends outpatient versus home health PT   Disposition--- Home with wife     Disposition: The patient is from: Home              Anticipated d/c is to: Home with therapy              Code Status :  -  Code Status: Full Code    Family Communication:  (patient is alert, awake and coherent)  Discussed with wife   Consults  :  Gi  Discharge Condition: stable  Follow UP   Follow-up Information     [facility] Follow up.   Specialty: Rehabilitation Why: Rehab staff will  call you to schedule appointments Contact information: [place] [phone] [place] [phone]                Diet and Activity recommendation:  As advised  Discharge Instructions    Discharge Instructions     Ambulatory referral to Occupational Therapy   Complete by: As directed    Ambulatory referral to Physical Therapy   Complete by: As directed    Call MD for:  difficulty breathing, headache or visual disturbances   Complete by: As directed    Call MD for:  persistant dizziness or light-headedness   Complete by: As directed    Call MD for:  persistant nausea and vomiting   Complete by: As directed    Call MD for:  severe uncontrolled pain   Complete by: As directed    Call MD for:  temperature >100.4   Complete by: As directed    Diet - low sodium heart healthy   Complete by: As directed    Discharge instructions   Complete by: As directed    1)Avoid ibuprofen/Advil/Aleve/Motrin/Goody Powders/Naproxen/BC powders/Meloxicam/Diclofenac/Indomethacin and other Nonsteroidal anti-inflammatory medications as these will make you more likely to bleed and can cause stomach ulcers, can also cause Kidney problems.   2)Resume Lovenox/Enoxaparin blood thinner 60 mg twice daily--- please hold Lovenox if any concerns about  Bleeding  3)Please hold losartan and Lasix/furosemide until you see your kidney doctor on 07/28/2021 for repeat blood work  4)Please keep your appointment with a nephrologist/kidney doctor on 07/28/2021--- you will need a repeat CBC and repeat BMP blood work at that visit  5)Outpatient physical therapy as scheduled, continue short distance mobility with assistance/walking device at home   Discharge wound care:   Complete by: As directed    Keep clean and dry   Increase activity slowly   Complete by: As directed        Discharge Medications     Allergies as of 07/25/2021       Reactions   Dilaudid  [hydromorphone] Shortness Of Breath        Medication List     STOP taking these medications    diazepam 5 MG tablet Commonly known as: VALIUM   HYDROcodone-acetaminophen 5-325 MG tablet Commonly known as: NORCO/VICODIN   losartan 100 MG tablet Commonly known as: COZAAR  pregabalin 75 MG capsule Commonly known as: LYRICA   tiZANidine 2 MG tablet Commonly known as: ZANAFLEX       TAKE these medications    acetaminophen 325 MG tablet Commonly known as: TYLENOL Take 2 tablets (650 mg total) by mouth every 6 (six) hours as needed for mild pain, headache or fever (or Fever >/= 101). What changed:  how much to take when to take this reasons to take this   amLODipine 5 MG tablet Commonly known as: NORVASC Take 1 tablet (5 mg total) by mouth daily. For BP Start taking on: July 26, 2021   amoxicillin-clavulanate 500-125 MG tablet Commonly known as: AUGMENTIN Take 1 tablet (500 mg total) by mouth 2 (two) times daily for 5 days.   augmented betamethasone dipropionate 0.05 % ointment Commonly known as: DIPROLENE-AF Apply topically.   citalopram 20 MG tablet Commonly known as: CELEXA Take 20 mg by mouth daily.   enoxaparin 60 MG/0.6ML injection Commonly known as: LOVENOX Inject 60 mg into the skin daily. What changed: Another medication with the same name was removed. Continue taking this medication, and follow the directions you see here.   Flovent Diskus 100 MCG/BLIST Aepb Generic drug: Fluticasone Propionate (Inhal) Inhale into the lungs.   furosemide 20 MG tablet Commonly known as: LASIX Take by mouth.   hydrocortisone 25 MG suppository Commonly known as: ANUSOL-HC Place 1 suppository (25 mg total) rectally 2 (two) times daily for 10 days.   methocarbamol 500 MG tablet Commonly known as: Robaxin Take 1 tablet (500 mg total) by mouth 3 (three) times daily. For Muscle Pain/Spasm   Myrbetriq 50 MG Tb24 tablet Generic drug: mirabegron ER Take 50 mg  by mouth daily.   omeprazole 20 MG capsule Commonly known as: PRILOSEC Take 1 capsule (20 mg total) by mouth daily. What changed: when to take this   oxyCODONE 5 MG immediate release tablet Commonly known as: Oxy IR/ROXICODONE Take by mouth.   polyethylene glycol 17 g packet Commonly known as: MIRALAX / GLYCOLAX Take 17 g by mouth 2 (two) times daily.   solifenacin 10 MG tablet Commonly known as: VESICARE Take 1 tablet by mouth daily.   vitamin C 1000 MG tablet Take by mouth.               Discharge Care Instructions  (From admission, onward)           Start     Ordered   07/25/21 0000  Discharge wound care:       Comments: Keep clean and dry   07/25/21 1152            Major procedures and Radiology Reports - PLEASE review detailed and final reports for all details, in brief -    DG Lumbar Spine Complete  Result Date: 07/21/2021 CLINICAL DATA:  Status post fall. EXAM: LUMBAR SPINE - COMPLETE 4+ VIEW COMPARISON:  June 18, 2021 FINDINGS: There is no evidence of lumbar spine fracture. Alignment is normal. Marked severity endplate sclerosis is seen at the levels of L3-L4, L4-L5 and L5-S1. Moderate to marked severity intervertebral disc space narrowing is also seen at these levels. Radiopaque surgical clips are seen within the right upper quadrant. Inferior vena cava filter is noted. IMPRESSION: 1. No acute findings in the lumbar spine. 2. 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 Electronically Signed   By: Virgina Norfolk M.D.   On: 07/21/2021 16:19   CT Head Wo Contrast  Result Date: 07/21/2021 CLINICAL  DATA:  Mental status change. EXAM: CT HEAD WITHOUT CONTRAST TECHNIQUE: Contiguous axial images were obtained from the base of the skull through the vertex without intravenous contrast. COMPARISON:  None. BRAIN: BRAIN Cerebral ventricle sizes are concordant with the degree of cerebral volume loss. Cerebral ventricle sizes are concordant  with the degree of cerebral volume loss. Patchy and confluent areas of decreased attenuation are noted throughout the deep and periventricular white matter of the cerebral hemispheres bilaterally, compatible with chronic microvascular ischemic disease. No evidence of large-territorial acute infarction. No parenchymal hemorrhage. No mass lesion. No extra-axial collection. No mass effect or midline shift. No hydrocephalus. Basilar cisterns are patent. Vascular: No hyperdense vessel. Atherosclerotic calcifications are present within the cavernous internal carotid arteries. Skull: No acute fracture or focal lesion. Sinuses/Orbits: Paranasal sinuses and mastoid air cells are clear. Bilateral lens replacement. Orbits are unremarkable. Other: None. IMPRESSION: No acute intracranial abnormality. Electronically Signed   By: Iven Finn M.D.   On: 07/21/2021 16:31   US RENAL  Result Date: 07/22/2021 CLINICAL DATA:  AKI EXAM: RENAL / URINARY TRACT ULTRASOUND COMPLETE COMPARISON:  None. FINDINGS: Evaluation of the kidneys is limited due to significant overlying bowel gas and patient immobility due to recent surgery. Right Kidney: The right lower pole is not well evaluated. Approximate renal measurements: 6.4 cm x 4.7 cm x 4.8 cm = volume: 76 cc mL. Echogenicity within normal limits. No definite mass or hydronephrosis visualized. Left Kidney: Approximate renal measurements: 8.5 cm x 5.0 cm x 5.6 cm = volume: 123 mL. Echogenicity within normal limits. No mass or hydronephrosis visualized. Bladder: Appears normal for degree of bladder distention. Both ureteral jets were seen. Other: None. IMPRESSION: 1. Evaluation of the kidneys is difficult due to significant overlying bowel gas and patient immobility. Within these confines, no abnormality was identified. No hydronephrosis. 2. Normal bladder with both ureteral jets identified. Electronically Signed   By: Valetta Mole M.D.   On: 07/22/2021 08:50   DG Hip Unilat W or Wo  Pelvis 2-3 Views Left  Result Date: 07/21/2021 CLINICAL DATA:  Status post fall 2 days ago. EXAM: DG HIP (WITH OR WITHOUT PELVIS) 2-3V LEFT COMPARISON:  June 18, 2021 FINDINGS: There is no evidence of hip fracture or dislocation. There is no evidence of arthropathy or other focal bone abnormality. IMPRESSION: Negative. Electronically Signed   By: Virgina Norfolk M.D.   On: 07/21/2021 16:18    Micro Results   Recent Results (from the past 240 hour(s))  Resp Panel by RT-PCR (Flu A&B, Covid) Nasopharyngeal Swab     Status: None   Collection Time: 07/22/21  3:22 AM   Specimen: Nasopharyngeal Swab; Nasopharyngeal(NP) swabs in vial transport medium  Result Value Ref Range Status   SARS Coronavirus 2 by RT PCR NEGATIVE NEGATIVE Final    Comment: (NOTE) SARS-CoV-2 target nucleic acids are NOT DETECTED.  The SARS-CoV-2 RNA is generally detectable in upper respiratory specimens during the acute phase of infection. The lowest concentration of SARS-CoV-2 viral copies this assay can detect is 138 copies/mL. A negative result does not preclude SARS-Cov-2 infection and should not be used as the sole basis for treatment or other patient management decisions. A negative result may occur with  improper specimen collection/handling, submission of specimen other than nasopharyngeal swab, presence of viral mutation(s) within the areas targeted by this assay, and inadequate number of viral copies(<138 copies/mL). A negative result must be combined with clinical observations, patient history, and epidemiological information. The expected result is Negative.  Fact Sheet for Patients:  EntrepreneurPulse.com.au  Fact Sheet for Healthcare Providers:  IncredibleEmployment.be  This test is no t yet approved or cleared by the [place] FDA and  has been authorized for detection and/or diagnosis of SARS-CoV-2 by FDA under an Emergency Use Authorization (EUA). This  EUA will remain  in effect (meaning this test can be used) for the duration of the COVID-19 declaration under Section 564(b)(1) of the Act, 21 U.S.C.section 360bbb-3(b)(1), unless the authorization is terminated  or revoked sooner.       Influenza A by PCR NEGATIVE NEGATIVE Final   Influenza B by PCR NEGATIVE NEGATIVE Final    Comment: (NOTE) The Xpert Xpress SARS-CoV-2/FLU/RSV plus assay is intended as an aid in the diagnosis of influenza from Nasopharyngeal swab specimens and should not be used as a sole basis for treatment. Nasal washings and aspirates are unacceptable for Xpert Xpress SARS-CoV-2/FLU/RSV testing.  Fact Sheet for Patients: EntrepreneurPulse.com.au  Fact Sheet for Healthcare Providers: IncredibleEmployment.be  This test is not yet approved or cleared by the [place] FDA and has been authorized for detection and/or diagnosis of SARS-CoV-2 by FDA under an Emergency Use Authorization (EUA). This EUA will remain in effect (meaning this test can be used) for the duration of the COVID-19 declaration under Section 564(b)(1) of the Act, 21 U.S.C. section 360bbb-3(b)(1), unless the authorization is terminated or revoked.  Performed at [facility], [address], [place], [place] [address]        Today   Subjective    Advith Martine today has no new complaints  No fever  Or chills   No Nausea, Vomiting or Diarrhea -- Wife at bedside, patient eager to go home       Patient has been seen and examined prior to discharge   Objective   Blood pressure 129/77, pulse 69, temperature 98 F (36.7 C), resp. rate 20, height 5\' 8"  (1.727 m), weight 101.6 kg, SpO2 100 %.   Intake/Output Summary (Last 24 hours) at 07/25/2021 1152 Last data filed at 07/25/2021 0900 Gross per 24 hour  Intake 642.33 ml  Output 500 ml  Net 142.33 ml    Exam Gen:- Awake Alert, in no acute distress HEENT:- [place].AT, No sclera  icterus Neck-Supple Neck,No JVD,.  Lungs-  CTAB , fair symmetrical air movement CV- S1, S2 normal, regular  Abd-  +ve B.Sounds, Abd Soft, No tenderness,    Extremity/Skin:-Significant chronic lymphedema of the right lower extremity  --pedal pulses present  Psych-affect is appropriate, oriented x3, mentation has improved significantly Neuro-generalized weakness, no new focal deficits, no tremors MSK-right shoulder sling due to recent surgery of the rotator cuff   Data Review   CBC w Diff:  Lab Results  Component Value Date   WBC 4.4 07/25/2021   HGB 8.5 (L) 07/25/2021   HCT 26.8 (L) 07/25/2021   PLT 124 (L) 07/25/2021   LYMPHOPCT 18 07/22/2021   MONOPCT 9 07/22/2021   EOSPCT 4 07/22/2021   BASOPCT 0 07/22/2021    CMP:  Lab Results  Component Value Date   NA 139 07/25/2021   K 5.1 07/25/2021   CL 116 (H) 07/25/2021   CO2 17 (L) 07/25/2021   BUN 42 (H) 07/25/2021   CREATININE 3.16 (H) 07/25/2021   PROT 6.2 (L) 07/23/2021   ALBUMIN 2.5 (L) 07/23/2021   ALBUMIN 2.6 (L) 07/23/2021   BILITOT 0.8 07/23/2021   ALKPHOS 151 (H) 07/23/2021   AST 40 07/23/2021   ALT 22 07/23/2021  .  Total Discharge time is about 33 minutes  Roxan Hockey M.D on 07/25/2021 at 11:52 AM  Go to [email or web address] -  for contact info  [facility] - Office  [phone]

## 2021-07-25 NOTE — Telephone Encounter (Signed)
[place] patient being discharged from hospital. Needs nonurgent follow up for stercoral colitis and abnormal LFTs.

## 2021-07-25 NOTE — Discharge Instructions (Signed)
1)Avoid ibuprofen/Advil/Aleve/Motrin/Goody Powders/Naproxen/BC powders/Meloxicam/Diclofenac/Indomethacin and other Nonsteroidal anti-inflammatory medications as these will make you more likely to bleed and can cause stomach ulcers, can also cause Kidney problems.   2)Resume Lovenox/Enoxaparin blood thinner 60 mg twice daily--- please hold Lovenox if any concerns about  Bleeding  3)Please hold losartan and Lasix/furosemide until you see your kidney doctor on 07/28/2021 for repeat blood work  4)Please keep your appointment with a nephrologist/kidney doctor on 07/28/2021--- you will need a repeat CBC and repeat BMP blood work at that visit  5)Outpatient physical therapy as scheduled, continue short distance mobility with assistance/walking device at home

## 2021-07-27 LAB — SURGICAL PATHOLOGY

## 2021-09-09 ENCOUNTER — Ambulatory Visit (INDEPENDENT_AMBULATORY_CARE_PROVIDER_SITE_OTHER): Payer: Medicare Other | Admitting: Gastroenterology

## 2021-09-09 ENCOUNTER — Encounter (INDEPENDENT_AMBULATORY_CARE_PROVIDER_SITE_OTHER): Payer: Self-pay | Admitting: Gastroenterology

## 2022-08-27 ENCOUNTER — Emergency Department (HOSPITAL_COMMUNITY)
Admission: EM | Admit: 2022-08-27 | Discharge: 2022-08-27 | Disposition: A | Discharge status: Home / Self Care | Payer: Medicare Other | Attending: Emergency Medicine | Admitting: Emergency Medicine

## 2022-08-27 ENCOUNTER — Emergency Department (HOSPITAL_COMMUNITY): Payer: Medicare Other

## 2022-08-27 ENCOUNTER — Other Ambulatory Visit: Payer: Self-pay

## 2022-08-27 DIAGNOSIS — R112 Nausea with vomiting, unspecified: Secondary | ICD-10-CM | POA: Insufficient documentation

## 2022-08-27 DIAGNOSIS — R109 Unspecified abdominal pain: Secondary | ICD-10-CM | POA: Insufficient documentation

## 2022-08-27 DIAGNOSIS — R531 Weakness: Secondary | ICD-10-CM | POA: Insufficient documentation

## 2022-08-27 LAB — CBC WITH DIFFERENTIAL/PLATELET
Abs Immature Granulocytes: 0.02 10*3/uL (ref 0.00–0.07)
Basophils Absolute: 0 10*3/uL (ref 0.0–0.1)
Basophils Relative: 0 %
Eosinophils Absolute: 0.1 10*3/uL (ref 0.0–0.5)
Eosinophils Relative: 1 %
HCT: 29.6 % — ABNORMAL LOW (ref 39.0–52.0)
Hemoglobin: 9.5 g/dL — ABNORMAL LOW (ref 13.0–17.0)
Immature Granulocytes: 0 %
Lymphocytes Relative: 5 %
Lymphs Abs: 0.3 10*3/uL — ABNORMAL LOW (ref 0.7–4.0)
MCH: 28.4 pg (ref 26.0–34.0)
MCHC: 32.1 g/dL (ref 30.0–36.0)
MCV: 88.6 fL (ref 80.0–100.0)
Monocytes Absolute: 0.2 10*3/uL (ref 0.1–1.0)
Monocytes Relative: 4 %
Neutro Abs: 4.9 10*3/uL (ref 1.7–7.7)
Neutrophils Relative %: 90 %
Platelets: 89 10*3/uL — ABNORMAL LOW (ref 150–400)
RBC: 3.34 MIL/uL — ABNORMAL LOW (ref 4.22–5.81)
RDW: 19 % — ABNORMAL HIGH (ref 11.5–15.5)
WBC: 5.4 10*3/uL (ref 4.0–10.5)
nRBC: 0 % (ref 0.0–0.2)

## 2022-08-27 LAB — COMPREHENSIVE METABOLIC PANEL
ALT: 17 U/L (ref 0–44)
AST: 32 U/L (ref 15–41)
Albumin: 3.1 g/dL — ABNORMAL LOW (ref 3.5–5.0)
Alkaline Phosphatase: 83 U/L (ref 38–126)
Anion gap: 4 — ABNORMAL LOW (ref 5–15)
BUN: 39 mg/dL — ABNORMAL HIGH (ref 8–23)
CO2: 20 mmol/L — ABNORMAL LOW (ref 22–32)
Calcium: 9.3 mg/dL (ref 8.9–10.3)
Chloride: 115 mmol/L — ABNORMAL HIGH (ref 98–111)
Creatinine, Ser: 2.79 mg/dL — ABNORMAL HIGH (ref 0.61–1.24)
GFR, Estimated: 23 mL/min — ABNORMAL LOW (ref >60)
Glucose, Bld: 100 mg/dL — ABNORMAL HIGH (ref 70–99)
Potassium: 4.2 mmol/L (ref 3.5–5.1)
Sodium: 139 mmol/L (ref 135–145)
Total Bilirubin: 0.3 mg/dL (ref 0.3–1.2)
Total Protein: 6.6 g/dL (ref 6.5–8.1)

## 2022-08-27 LAB — LIPASE, BLOOD: Lipase: 32 U/L (ref 11–51)

## 2022-08-27 MED ORDER — ONDANSETRON HCL 4 MG/2ML IJ SOLN
4.0000 mg | Freq: Once | INTRAMUSCULAR | Status: AC
  Administered 2022-08-27: 4 mg via INTRAVENOUS
  Filled 2022-08-27: qty 2

## 2022-08-27 MED ORDER — LACTATED RINGERS IV BOLUS
1000.0000 mL | Freq: Once | INTRAVENOUS | Status: AC
  Administered 2022-08-27: 1000 mL via INTRAVENOUS

## 2022-08-27 MED ORDER — ONDANSETRON 4 MG PO TBDP: ORAL_TABLET | ORAL | 0 refills | Status: DC

## 2022-08-27 MED ORDER — FENTANYL CITRATE PF 50 MCG/ML IJ SOSY
50.0000 ug | PREFILLED_SYRINGE | Freq: Once | INTRAMUSCULAR | Status: DC
  Filled 2022-08-27: qty 1

## 2022-08-27 NOTE — ED Provider Notes (Signed)
[facility] EMERGENCY DEPARTMENT Provider Note   CSN: [id number] Arrival date & time: 08/27/22  0310     History  Chief Complaint  Patient presents with   Abdominal Pain    Carlos Rodriguez is a 76 y.o. male.  76 year old male that presents with his wife who supplies most of the history.  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  Soundly tonight he had an episode of sarcoidosis rigors?  His wife states he is to get these more often does not get him as often now but she is used to it.  He will get a fever sometimes and then have rigors should just give him some Tylenol and he will get over it.  She states that that was not really the problem but afterwards he had an episode of emesis which is abnormal for him.  She states he had red Kool-Aid earlier in the night but when he threw up it was just red and chunky and she was concerned that he might have something more serious going on so EMS was called.  At this time patient has some mild abdominal discomfort and dyspnea but no other associated symptoms.  He does take Lovenox daily routinely.  No cough.  No diarrhea.   Abdominal Pain      Home Medications Prior to Admission medications   Medication Sig Start Date End Date Taking? Authorizing Provider  ondansetron (ZOFRAN-ODT) 4 MG disintegrating tablet '4mg'$  ODT q4 hours prn nausea/vomit 08/27/22  Yes Sharrie Self, Corene Cornea, MD  acetaminophen (TYLENOL) 325 MG tablet Take 2 tablets (650 mg total) by mouth every 6 (six) hours as needed for mild pain, headache or fever (or Fever >/= 101). 07/25/21   Roxan Hockey, MD  amLODipine (NORVASC) 5 MG tablet Take 1 tablet (5 mg total) by mouth daily. For BP 07/26/21   Emokpae, Courage, MD  Ascorbic Acid (VITAMIN C) 1000 MG tablet Take by mouth. Patient not taking: Reported on 07/21/2021     [provider]  augmented betamethasone dipropionate (DIPROLENE-AF) 0.05 % ointment Apply topically. Patient not taking: Reported on 07/21/2021 02/03/21   [provider]  citalopram (CELEXA) 20 MG tablet Take 20 mg by mouth daily. Patient not taking: Reported on 07/21/2021 04/12/21   [provider]  enoxaparin (LOVENOX) 60 MG/0.6ML injection Inject 60 mg into the skin daily. 04/18/19   [provider]  Fluticasone Propionate, Inhal, (FLOVENT DISKUS) 100 MCG/BLIST AEPB Inhale into the lungs. Patient not taking: Reported on 07/21/2021    [provider]  furosemide (LASIX) 20 MG tablet Take by mouth. Patient not taking: Reported on 07/21/2021 02/16/20   [provider]  methocarbamol (ROBAXIN) 500 MG tablet Take 1 tablet (500 mg total) by mouth 3 (three) times daily. For Muscle Pain/Spasm 07/25/21   Emokpae, Courage, MD  MYRBETRIQ 50 MG TB24 tablet Take 50 mg by mouth daily. 07/16/21   [provider]  omeprazole (PRILOSEC) 20 MG capsule Take 1 capsule (20 mg total) by mouth daily. 07/25/21   Roxan Hockey, MD  oxyCODONE (OXY IR/ROXICODONE) 5 MG immediate release tablet Take by mouth. 07/11/21   [provider]  polyethylene glycol (MIRALAX / GLYCOLAX) 17 g packet Take 17 g by mouth 2 (two) times daily. 07/25/21   Roxan Hockey, MD  solifenacin (VESICARE) 10 MG tablet Take 1 tablet by mouth daily. 06/27/21  [provider]      Allergies    Dilaudid [hydromorphone]    Review of Systems   Review of Systems  Gastrointestinal:  Positive for abdominal pain.    Physical Exam Updated Vital Signs BP 121/68   Pulse 72   Temp 98.6 F (37 C)   Resp 20   Ht '5\' 8"'$  (1.727 m)   Wt 85.3 kg   SpO2 99%   BMI 28.59 kg/m  Physical Exam Vitals and nursing note reviewed.  Constitutional:      Appearance: He is well-developed.  HENT:     Head: Normocephalic and atraumatic.  Cardiovascular:     Rate and Rhythm:  Normal rate.  Pulmonary:     Effort: Pulmonary effort is normal. No respiratory distress.  Abdominal:     General: There is no distension.     Palpations: Abdomen is soft.     Tenderness: There is no abdominal tenderness.  Musculoskeletal:        General: Swelling (Right lower extremity is significantly swollen compared to the left is at baseline according to wife secondary to multiple proximal surgeries and lymph disruption) present. Normal range of motion.     Cervical back: Normal range of motion.  Skin:    General: Skin is warm and dry.     Comments: Total scars on his inner right thigh and open wound to his right lower extremity does not appear infected.  Neurological:     General: No focal deficit present.     Mental Status: He is alert.     ED Results / Procedures / Treatments   Labs (all labs ordered are listed, but only abnormal results are displayed) Labs Reviewed  CBC WITH DIFFERENTIAL/PLATELET - Abnormal; Notable for the following components:      Result Value   RBC 3.34 (*)    Hemoglobin 9.5 (*)    HCT 29.6 (*)    RDW 19.0 (*)    Platelets 89 (*)    Lymphs Abs 0.3 (*)    All other components within normal limits  COMPREHENSIVE METABOLIC PANEL - Abnormal; Notable for the following components:   Chloride 115 (*)    CO2 20 (*)    Glucose, Bld 100 (*)    BUN 39 (*)    Creatinine, Ser 2.79 (*)    Albumin 3.1 (*)    GFR, Estimated 23 (*)    Anion gap 4 (*)    All other components within normal limits  LIPASE, BLOOD    EKG None  Radiology DG Chest Portable 1 View  Result Date: 08/27/2022 CLINICAL DATA:  Shortness of breath EXAM: PORTABLE CHEST 1 VIEW COMPARISON:  02/11/2022 FINDINGS: Normal heart size and stable mediastinal contours. Atrial septal occluder. There is no edema, consolidation, effusion, or pneumothorax. Right shoulder replacement. No acute osseous finding. IMPRESSION: No evidence of active disease. Electronically Signed   By: Jorje Guild  M.D.   On: 08/27/2022 04:29    Procedures Procedures    Medications Ordered in ED Medications  lactated ringers bolus 1,000 mL (0 mLs Intravenous Stopped 08/27/22 0555)  ondansetron (ZOFRAN) injection 4 mg (4 mg Intravenous Given 08/27/22 0350)    ED Course/ Medical Decision Making/ A&P                           Medical Decision Making Amount and/or Complexity of Data Reviewed Labs: ordered. Radiology: ordered.  Risk Prescription drug management.  Sound like  the main thing that is out of the normal for him is his emesis and generalized weakness.  We will give some fluids and some nausea meds and reevaluate.  We will check his labs make shows no significant dehydration, liver or pancreas involvement.  Chest x-ray for the shortness of breath to make sure he does not have any evidence of aspiration, effusion or pneumonia. CXR done and showed widened mediastinum but no obvious pneumonia or other acute etiology (independently viewed and interpreted by myself and radiology read reviewed). Symptoms improved. Feels at baseline. Tolerating PO. Shared decision making and since he feels better and no other acute changes will forego further imaging or admission. Will fu w/ outpatient doctors for thrombocytopenia. Rest of labs to include CKD, hemoglobin are all near baseline.    Final Clinical Impression(s) / ED Diagnoses Final diagnoses:  Nausea and vomiting, unspecified vomiting type    Rx / DC Orders ED Discharge Orders          Ordered    ondansetron (ZOFRAN-ODT) 4 MG disintegrating tablet        08/27/22 0544              Hasten Sweitzer, Corene Cornea, MD 08/27/22 [phone]

## 2022-08-27 NOTE — ED Notes (Signed)
Went over [employer] with patient and wife. Verbalized understanding.

## 2022-08-27 NOTE — ED Triage Notes (Signed)
Presents with abdominal pain with N/V that started tonight. Pt has tumors to R leg and starts radiation tomorrow. Swelling to right leg noted.

## 2022-12-16 IMAGING — US US RENAL
1 series · 14 of 25 positions shown · non-contrast
Comparison: None.

CLINICAL DATA: DAZ

EXAM:
RENAL / URINARY TRACT ULTRASOUND COMPLETE

[Series 1: us renal · 14 of 71 slices shown]
[im 1/71]
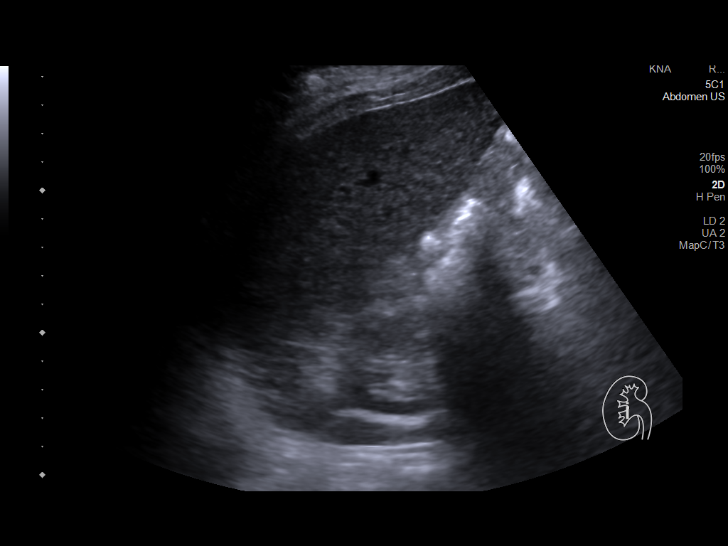
[im 6/71]
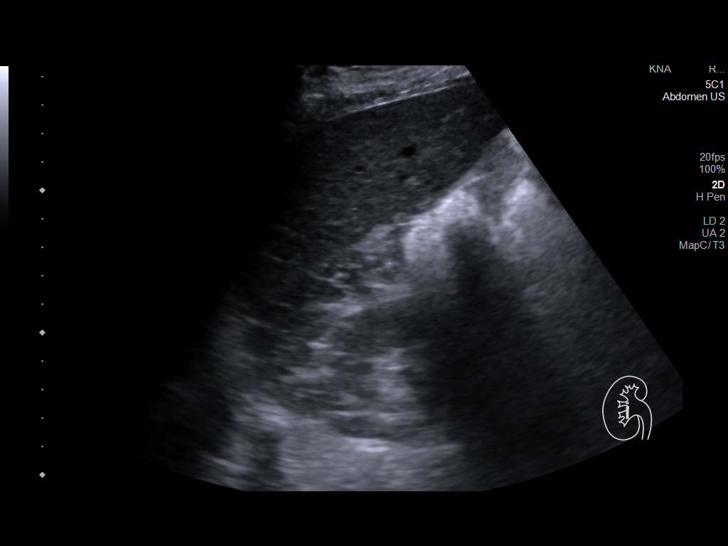
[im 12/71]
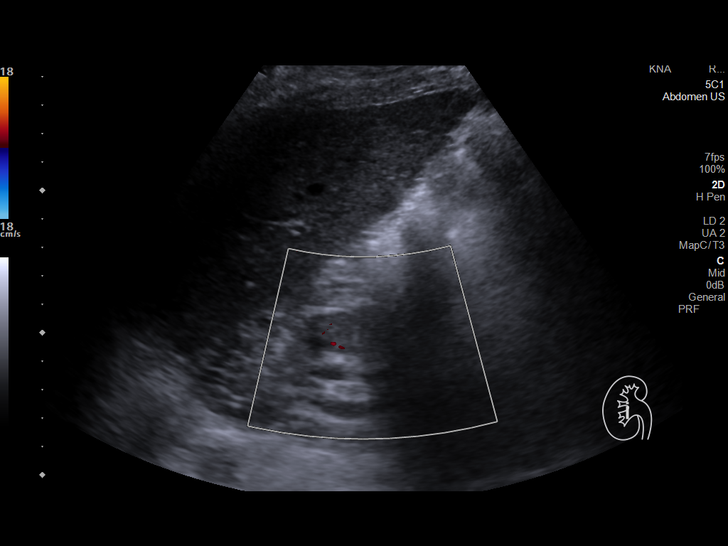
[im 18/71]
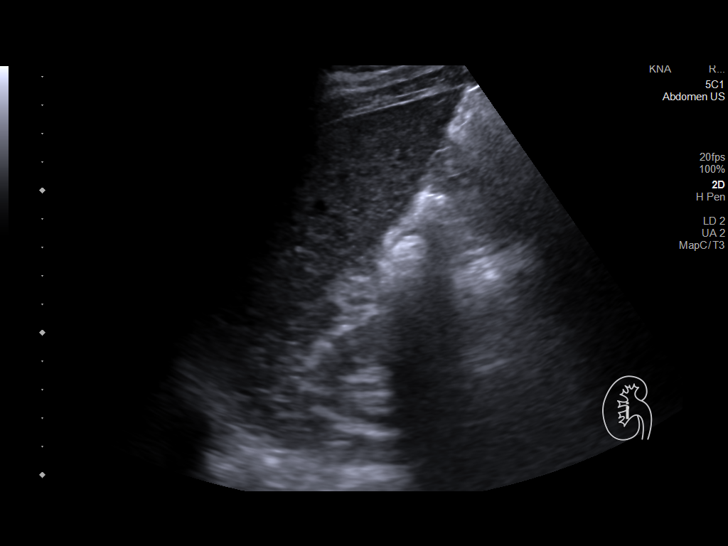
[im 24/71]
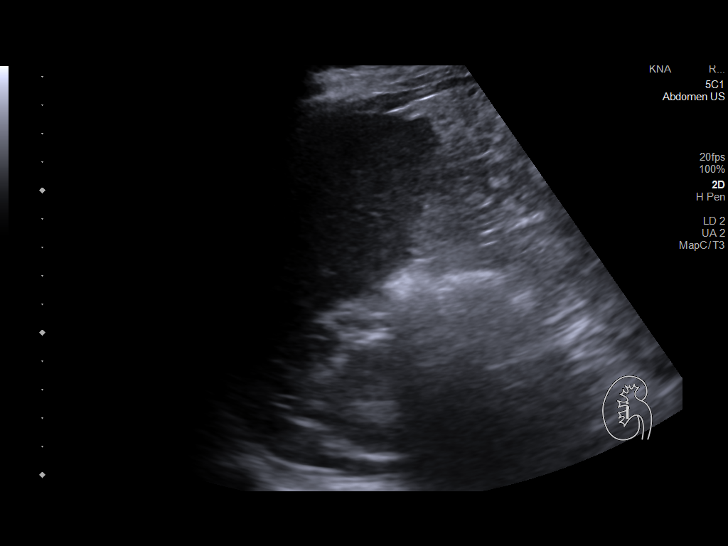
[im 27/71]
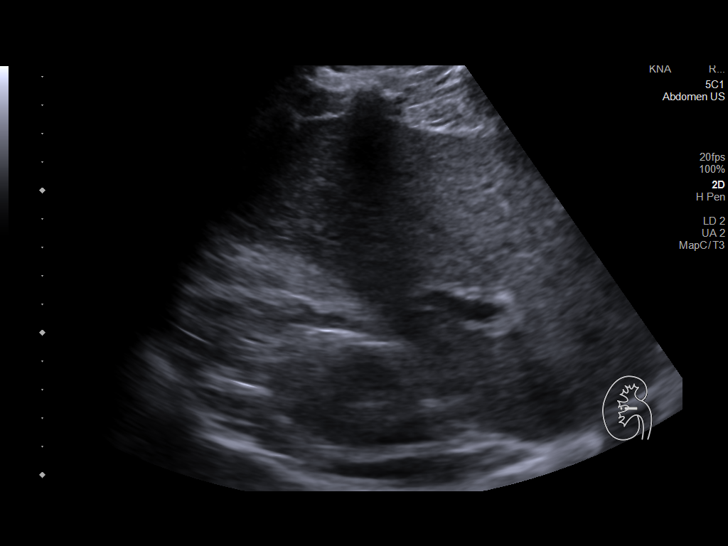
[im 33/71]
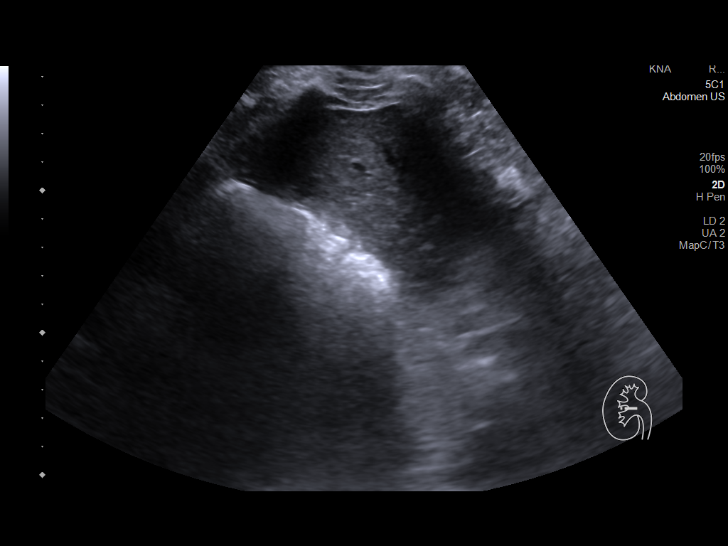
[im 38/71]
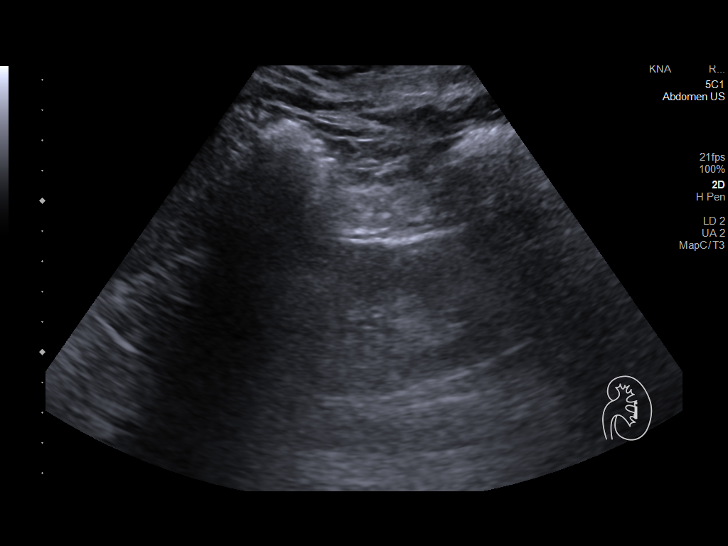
[im 44/71]
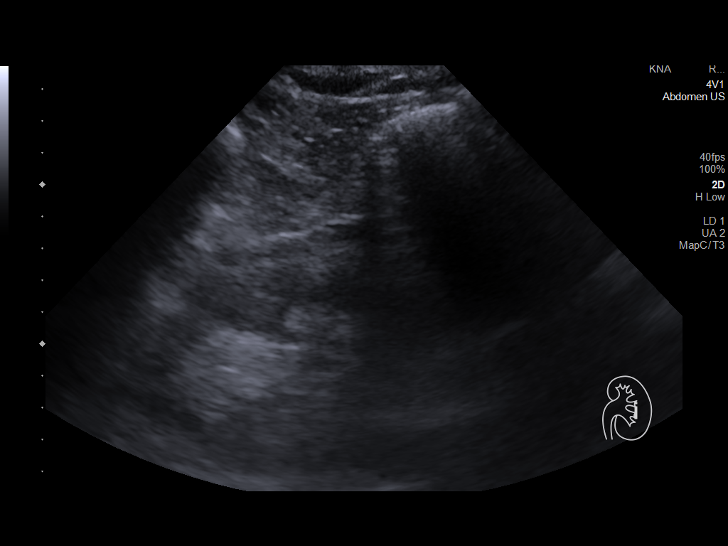
[im 47/71]
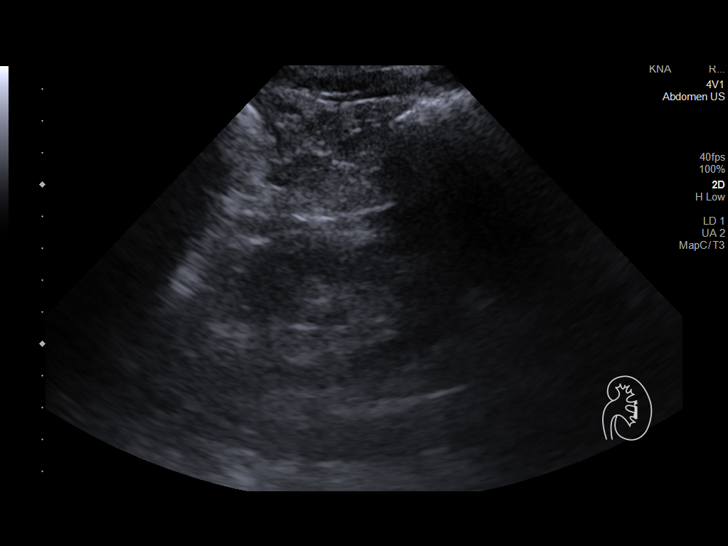
[im 53/71]
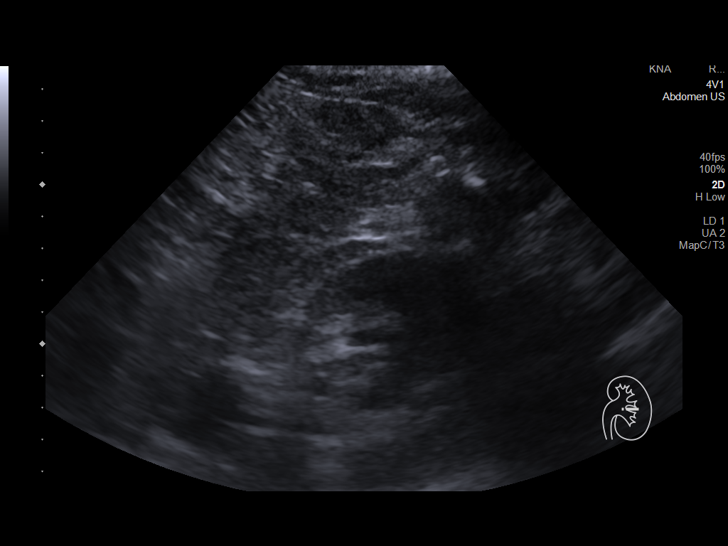
[im 59/71]
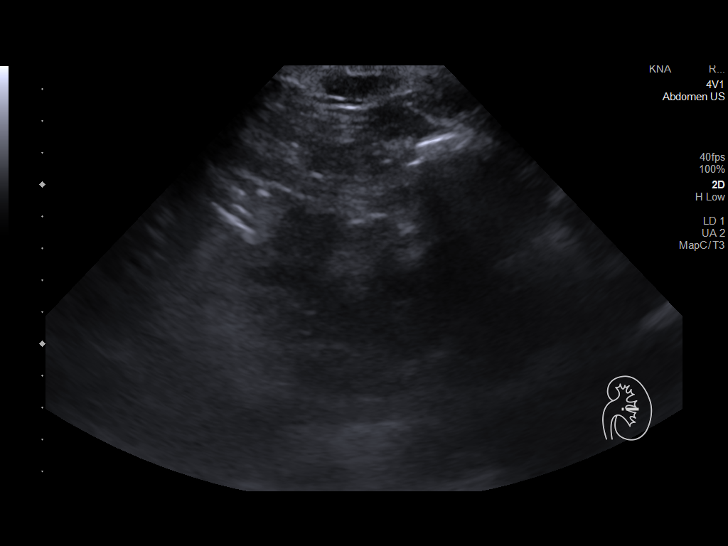
[im 65/71]
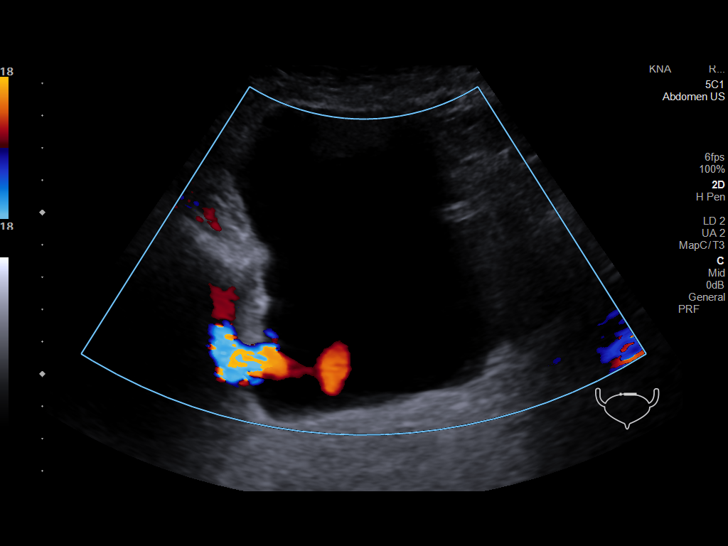
[im 71/71]
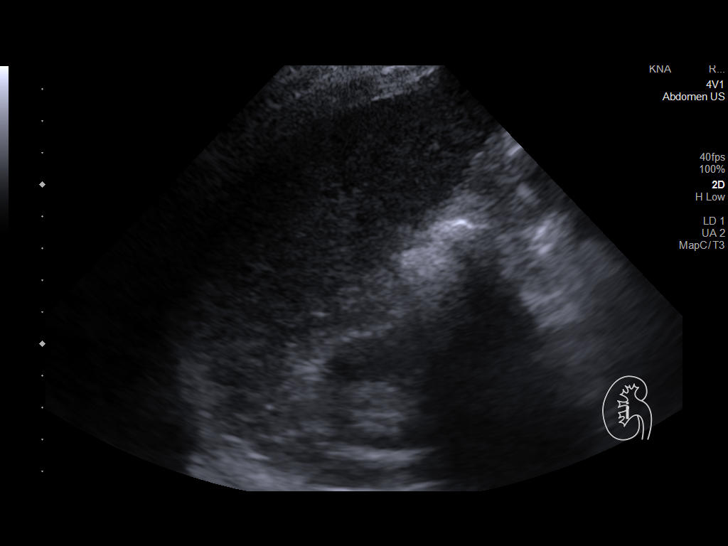

[14 of 25 positions shown; findings below may reference images not displayed]

FINDINGS: Evaluation of the kidneys is limited due to significant overlying
bowel gas and patient immobility due to recent surgery.

Right Kidney:

The right lower pole is not well evaluated. Approximate renal
measurements: 6.4 cm x 4.7 cm x 4.8 cm = volume: 76 cc mL.
Echogenicity within normal limits. No definite mass or
hydronephrosis visualized.

Left Kidney:

Approximate renal measurements: 8.5 cm x 5.0 cm x 5.6 cm = volume:
123 mL. Echogenicity within normal limits. No mass or hydronephrosis
visualized.

Bladder:

Appears normal for degree of bladder distention. Both ureteral jets
were seen.

Other:

None.
IMPRESSION: 1. Evaluation of the kidneys is difficult due to significant
overlying bowel gas and patient immobility. Within these confines,
no abnormality was identified. No hydronephrosis.
2. Normal bladder with both ureteral jets identified.

## 2023-03-12 ENCOUNTER — Observation Stay (HOSPITAL_COMMUNITY)
Admission: EM | Admit: 2023-03-12 | Discharge: 2023-03-14 | Disposition: A | Discharge status: Home / Self Care | Payer: Medicare Other | Attending: Internal Medicine | Admitting: Internal Medicine

## 2023-03-12 DIAGNOSIS — K219 Gastro-esophageal reflux disease without esophagitis: Secondary | ICD-10-CM | POA: Insufficient documentation

## 2023-03-12 DIAGNOSIS — K6289 Other specified diseases of anus and rectum: Secondary | ICD-10-CM | POA: Insufficient documentation

## 2023-03-12 DIAGNOSIS — I129 Hypertensive chronic kidney disease with stage 1 through stage 4 chronic kidney disease, or unspecified chronic kidney disease: Secondary | ICD-10-CM | POA: Insufficient documentation

## 2023-03-12 DIAGNOSIS — J449 Chronic obstructive pulmonary disease, unspecified: Secondary | ICD-10-CM | POA: Insufficient documentation

## 2023-03-12 DIAGNOSIS — D62 Acute posthemorrhagic anemia: Secondary | ICD-10-CM | POA: Insufficient documentation

## 2023-03-12 DIAGNOSIS — K5731 Diverticulosis of large intestine without perforation or abscess with bleeding: Secondary | ICD-10-CM | POA: Diagnosis not present

## 2023-03-12 DIAGNOSIS — Z86718 Personal history of other venous thrombosis and embolism: Secondary | ICD-10-CM | POA: Diagnosis not present

## 2023-03-12 DIAGNOSIS — Z8546 Personal history of malignant neoplasm of prostate: Secondary | ICD-10-CM | POA: Diagnosis not present

## 2023-03-12 DIAGNOSIS — I10 Essential (primary) hypertension: Secondary | ICD-10-CM | POA: Diagnosis present

## 2023-03-12 DIAGNOSIS — I480 Paroxysmal atrial fibrillation: Secondary | ICD-10-CM | POA: Insufficient documentation

## 2023-03-12 DIAGNOSIS — R718 Other abnormality of red blood cells: Secondary | ICD-10-CM | POA: Insufficient documentation

## 2023-03-12 DIAGNOSIS — J45909 Unspecified asthma, uncomplicated: Secondary | ICD-10-CM | POA: Diagnosis not present

## 2023-03-12 DIAGNOSIS — N1832 Chronic kidney disease, stage 3b: Secondary | ICD-10-CM | POA: Insufficient documentation

## 2023-03-12 DIAGNOSIS — W900XXA Exposure to radiofrequency, initial encounter: Secondary | ICD-10-CM | POA: Insufficient documentation

## 2023-03-12 DIAGNOSIS — Z86711 Personal history of pulmonary embolism: Secondary | ICD-10-CM | POA: Diagnosis present

## 2023-03-12 DIAGNOSIS — N3281 Overactive bladder: Secondary | ICD-10-CM | POA: Insufficient documentation

## 2023-03-12 DIAGNOSIS — K648 Other hemorrhoids: Secondary | ICD-10-CM | POA: Insufficient documentation

## 2023-03-12 DIAGNOSIS — K921 Melena: Secondary | ICD-10-CM | POA: Diagnosis present

## 2023-03-12 DIAGNOSIS — D509 Iron deficiency anemia, unspecified: Secondary | ICD-10-CM | POA: Insufficient documentation

## 2023-03-12 DIAGNOSIS — K529 Noninfective gastroenteritis and colitis, unspecified: Secondary | ICD-10-CM | POA: Diagnosis not present

## 2023-03-12 DIAGNOSIS — K627 Radiation proctitis: Secondary | ICD-10-CM | POA: Diagnosis not present

## 2023-03-12 DIAGNOSIS — Z7952 Long term (current) use of systemic steroids: Secondary | ICD-10-CM | POA: Insufficient documentation

## 2023-03-12 DIAGNOSIS — D649 Anemia, unspecified: Secondary | ICD-10-CM

## 2023-03-12 DIAGNOSIS — K922 Gastrointestinal hemorrhage, unspecified: Secondary | ICD-10-CM

## 2023-03-13 ENCOUNTER — Encounter (HOSPITAL_COMMUNITY): Payer: Self-pay | Admitting: Radiology

## 2023-03-13 ENCOUNTER — Other Ambulatory Visit: Payer: Self-pay

## 2023-03-13 DIAGNOSIS — K627 Radiation proctitis: Secondary | ICD-10-CM

## 2023-03-13 DIAGNOSIS — D62 Acute posthemorrhagic anemia: Secondary | ICD-10-CM | POA: Diagnosis not present

## 2023-03-13 DIAGNOSIS — K922 Gastrointestinal hemorrhage, unspecified: Secondary | ICD-10-CM

## 2023-03-13 DIAGNOSIS — D509 Iron deficiency anemia, unspecified: Secondary | ICD-10-CM

## 2023-03-13 DIAGNOSIS — D72819 Decreased white blood cell count, unspecified: Secondary | ICD-10-CM

## 2023-03-13 DIAGNOSIS — N1832 Chronic kidney disease, stage 3b: Secondary | ICD-10-CM

## 2023-03-13 DIAGNOSIS — K5909 Other constipation: Secondary | ICD-10-CM | POA: Diagnosis not present

## 2023-03-13 DIAGNOSIS — N3281 Overactive bladder: Secondary | ICD-10-CM

## 2023-03-13 DIAGNOSIS — I10 Essential (primary) hypertension: Secondary | ICD-10-CM

## 2023-03-13 DIAGNOSIS — E8809 Other disorders of plasma-protein metabolism, not elsewhere classified: Secondary | ICD-10-CM

## 2023-03-13 DIAGNOSIS — Z86711 Personal history of pulmonary embolism: Secondary | ICD-10-CM

## 2023-03-13 DIAGNOSIS — K529 Noninfective gastroenteritis and colitis, unspecified: Secondary | ICD-10-CM

## 2023-03-13 DIAGNOSIS — K921 Melena: Secondary | ICD-10-CM | POA: Diagnosis not present

## 2023-03-13 DIAGNOSIS — D649 Anemia, unspecified: Secondary | ICD-10-CM

## 2023-03-13 DIAGNOSIS — Z8546 Personal history of malignant neoplasm of prostate: Secondary | ICD-10-CM

## 2023-03-13 DIAGNOSIS — E46 Unspecified protein-calorie malnutrition: Secondary | ICD-10-CM

## 2023-03-13 DIAGNOSIS — R718 Other abnormality of red blood cells: Secondary | ICD-10-CM | POA: Insufficient documentation

## 2023-03-13 DIAGNOSIS — K625 Hemorrhage of anus and rectum: Secondary | ICD-10-CM | POA: Diagnosis not present

## 2023-03-13 DIAGNOSIS — K219 Gastro-esophageal reflux disease without esophagitis: Secondary | ICD-10-CM

## 2023-03-13 LAB — CBC WITH DIFFERENTIAL/PLATELET
Abs Immature Granulocytes: 0.01 10*3/uL (ref 0.00–0.07)
Basophils Absolute: 0 10*3/uL (ref 0.0–0.1)
Basophils Relative: 0 %
Eosinophils Absolute: 0.1 10*3/uL (ref 0.0–0.5)
Eosinophils Relative: 4 %
HCT: 17.7 % — ABNORMAL LOW (ref 39.0–52.0)
Hemoglobin: 5.3 g/dL — CL (ref 13.0–17.0)
Immature Granulocytes: 0 %
Lymphocytes Relative: 27 %
Lymphs Abs: 1.1 10*3/uL (ref 0.7–4.0)
MCH: 23.2 pg — ABNORMAL LOW (ref 26.0–34.0)
MCHC: 29.9 g/dL — ABNORMAL LOW (ref 30.0–36.0)
MCV: 77.6 fL — ABNORMAL LOW (ref 80.0–100.0)
Monocytes Absolute: 0.5 10*3/uL (ref 0.1–1.0)
Monocytes Relative: 13 %
Neutro Abs: 2.2 10*3/uL (ref 1.7–7.7)
Neutrophils Relative %: 56 %
Platelets: 155 10*3/uL (ref 150–400)
RBC: 2.28 MIL/uL — ABNORMAL LOW (ref 4.22–5.81)
RDW: 17.7 % — ABNORMAL HIGH (ref 11.5–15.5)
WBC: 3.9 10*3/uL — ABNORMAL LOW (ref 4.0–10.5)
nRBC: 0 % (ref 0.0–0.2)

## 2023-03-13 LAB — COMPREHENSIVE METABOLIC PANEL
ALT: 13 U/L (ref 0–44)
AST: 24 U/L (ref 15–41)
Albumin: 3 g/dL — ABNORMAL LOW (ref 3.5–5.0)
Alkaline Phosphatase: 113 U/L (ref 38–126)
Anion gap: 7 (ref 5–15)
BUN: 33 mg/dL — ABNORMAL HIGH (ref 8–23)
CO2: 19 mmol/L — ABNORMAL LOW (ref 22–32)
Calcium: 9.2 mg/dL (ref 8.9–10.3)
Chloride: 108 mmol/L (ref 98–111)
Creatinine, Ser: 2.17 mg/dL — ABNORMAL HIGH (ref 0.61–1.24)
GFR, Estimated: 31 mL/min — ABNORMAL LOW (ref >60)
Glucose, Bld: 126 mg/dL — ABNORMAL HIGH (ref 70–99)
Potassium: 4 mmol/L (ref 3.5–5.1)
Sodium: 134 mmol/L — ABNORMAL LOW (ref 135–145)
Total Bilirubin: 0.4 mg/dL (ref 0.3–1.2)
Total Protein: 7.2 g/dL (ref 6.5–8.1)

## 2023-03-13 LAB — IRON AND TIBC
Iron: 14 ug/dL — ABNORMAL LOW (ref 45–182)
Saturation Ratios: 3 % — ABNORMAL LOW (ref 17.9–39.5)
TIBC: 427 ug/dL (ref 250–450)
UIBC: 413 ug/dL

## 2023-03-13 LAB — TYPE AND SCREEN: Antibody Screen: NEGATIVE

## 2023-03-13 LAB — FERRITIN: Ferritin: 8 ng/mL — ABNORMAL LOW (ref 24–336)

## 2023-03-13 LAB — PREPARE RBC (CROSSMATCH)

## 2023-03-13 LAB — BPAM RBC: Unit Type and Rh: 5100

## 2023-03-13 LAB — ABO/RH: ABO/RH(D): O POS

## 2023-03-13 MED ORDER — AMLODIPINE BESYLATE 5 MG PO TABS
5.0000 mg | ORAL_TABLET | Freq: Every day | ORAL | Status: DC
  Administered 2023-03-13 – 2023-03-14 (×2): 5 mg via ORAL
  Filled 2023-03-13 (×2): qty 1

## 2023-03-13 MED ORDER — FESOTERODINE FUMARATE ER 4 MG PO TB24: 4.0000 mg | ORAL_TABLET | Freq: Every day | ORAL | Status: DC

## 2023-03-13 MED ORDER — MIRABEGRON ER 25 MG PO TB24: 50.0000 mg | ORAL_TABLET | Freq: Every day | ORAL | Status: DC

## 2023-03-13 MED ORDER — SODIUM CHLORIDE 0.9% IV SOLUTION: Freq: Once | INTRAVENOUS | Status: AC

## 2023-03-13 MED ORDER — PANTOPRAZOLE SODIUM 40 MG IV SOLR
40.0000 mg | Freq: Two times a day (BID) | INTRAVENOUS | Status: DC
  Administered 2023-03-13 – 2023-03-14 (×4): 40 mg via INTRAVENOUS
  Filled 2023-03-13 (×4): qty 10

## 2023-03-13 MED ORDER — ENSURE ENLIVE PO LIQD
237.0000 mL | Freq: Two times a day (BID) | ORAL | Status: DC
  Administered 2023-03-14: 237 mL via ORAL

## 2023-03-13 MED ORDER — SODIUM CHLORIDE 0.9 % IV SOLN: INTRAVENOUS | Status: DC

## 2023-03-13 MED ORDER — SODIUM CHLORIDE 0.9% IV SOLUTION: Freq: Once | INTRAVENOUS | Status: DC

## 2023-03-13 MED ORDER — SERTRALINE HCL 50 MG PO TABS
100.0000 mg | ORAL_TABLET | Freq: Every day | ORAL | Status: DC
  Administered 2023-03-13 – 2023-03-14 (×2): 100 mg via ORAL
  Filled 2023-03-13 (×2): qty 2

## 2023-03-13 NOTE — ED Triage Notes (Signed)
Pt had blood work done at [facility] and got a call  back tonight stating that his blood counts were critically low and he should come to the hospital. Pt HGB is 5.6.

## 2023-03-13 NOTE — Progress Notes (Signed)
Pt admitted to unit from ED. Alert and oriented x4. Arrived to unit with blood transfusing, pt denies pain or distress. Call bell in reach.

## 2023-03-13 NOTE — Hospital Course (Addendum)
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 The patient was admitted to the [facility] rehabilitation unit from 01/12/2023 to 01/28/2023.  The patient did have episodes of hematochezia during his inpatient stay.  His hemoglobin did drop to less than 7.  GI was consulted.  He underwent colonoscopy on 01/15/2023.  Colonoscopy revealed diverticula in the ascending colon, descending colon, and sigmoid colon without bleeding.  Rectal mucosa had diffuse active oozing of blood with erythema and friability.  He was treated with APC.  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  His hemoglobin was 8.7 on 01/26/2023 at the time of his discharge.  Since his discharge, the patient has noted an increased frequency of his hematochezia.  He denies any fevers, chills, chest pain, shortness of breath, nausea, vomiting, abdominal pain.  He does have some dyspnea on exertion.  He had routine follow-up with his PCP and blood work was obtained.  His hemoglobin was noted to be 5.6, and the patient was called to go to the emergency department for further evaluation and treatment.  In the ED, the patient was afebrile and hemodynamically stable with oxygen saturation 100% on room air.  WBC 3.9, hemoglobin 5.3, platelets 1 55,000.  Sodium 134, potassium 4.0, bicarb 19, serum creatinine 2.17.  LFTs were unremarkable.  Iron saturation 3%, ferritin 8.  The patient was transfused 2 units PRBC.  GI was consulted to assist with management.

## 2023-03-13 NOTE — Progress Notes (Addendum)
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 The patient was admitted to the [facility] rehabilitation unit from 01/12/2023 to 01/28/2023.  The patient did have episodes of hematochezia during his inpatient stay.  His hemoglobin did drop to less than 7.  GI was consulted.  He underwent colonoscopy on 01/15/2023.  Colonoscopy revealed diverticula in the ascending colon, descending colon, and sigmoid colon without bleeding.  Rectal mucosa had diffuse active oozing of blood with erythema and friability.  He was treated with APC.  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  His hemoglobin was 8.7 on 01/26/2023 at the time of his discharge.  Since his discharge, the patient has noted an increased frequency of his hematochezia.  He denies any fevers, chills, chest pain, shortness of breath, nausea, vomiting, abdominal pain.  He does have some dyspnea on exertion.  He had routine follow-up with his PCP and blood work was obtained.  His hemoglobin was noted to be 5.6, and the patient was called to go to the emergency department for further evaluation and treatment.  In the ED, the patient was afebrile and hemodynamically stable with oxygen saturation 100% on room air.  WBC 3.9, hemoglobin 5.3, platelets 1 55,000.  Sodium 134, potassium 4.0, bicarb 19, serum creatinine 2.17.  LFTs were unremarkable.  Iron saturation 3%, ferritin 8.  The patient was transfused 2 units PRBC.  GI was consulted to assist with management.    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 -GI consulted -Presented with hemoglobin 5.3 -Previously on Lovenox--this has been on hold secondary to his GI bleeding  Essential  hypertension -Continue amlodipine  Paroxysmal atrial fibrillation -This occurred in the postop setting 6 years ago -Patient only had 1 episode in the past -Currently off anticoagulation secondary to GI bleed since Jan 2024  CKD stage IIIb -Wide variations in his serum creatinine -Appears that his baseline is 1.7-2.1 -Monitor serial BMP  Iron deficiency anemia -Secondary to chronic blood loss -Iron saturation 3%, ferritin -Transfuse iron once GI workup is done  Opioid dependence/chronic pain syndrome -Patient uses oxycodone sparingly -PDMP reviewed--oxycodone IR 5 mg, #42, last refill 02/16/23  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 -Try melantonin first  History of DVT/PE -As discussed above, patient is currently off anticoagulation secondary to GI bleed  Anxiety and depression -Continue sertraline  History of prostate cancer Patient was no longer having radiotherapy--last done 4 years ago He follows with [facility]  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  Greater than 50% spent face to face counseling and coordinating care.   Subjective: Patient denies fevers, chills, headache, chest pain, dyspnea, nausea, vomiting, diarrhea, abdominal pain, dysuria, hematuria, and melena. He has some dyspnea on exertion.  Objective: Vitals:   03/13/23 8119 03/13/23 1478 03/13/23 0815 03/13/23 0831  BP: 129/73 128/76 135/80 135/80  Pulse: 65 71 60 63  Resp: 17 16 18 18   Temp: 99.1 F (37.3 C) 98.7 F (37.1 C) 98.1 F (36.7 C) 98.3 F (36.8  C)  TempSrc: Oral Oral Oral Oral  SpO2: 100%  100% 100%  Weight: 66.4 kg     Height:        Intake/Output Summary (Last 24 hours) at 03/13/2023 0900 Last data filed at 03/13/2023 0345 Gross per 24 hour  Intake 352 ml  Output --  Net 352 ml   Weight change:  Exam:  General:  Pt is alert, follows commands appropriately, not in acute distress HEENT: No icterus, No thrush, No neck mass, [place]/AT Cardiovascular: RRR, S1/S2, no rubs, no gallops Respiratory: CTA bilaterally, no wheezing, no crackles, no rhonchi Abdomen: Soft/+BS, non tender, non distended, no guarding Extremities: No edema, No lymphangitis, No petechiae, No rashes, no synovitis   Data Reviewed: I have personally reviewed following labs and imaging studies Basic Metabolic Panel: Recent Labs  Lab 03/13/23 0030  NA 134*  K 4.0  CL 108  CO2 19*  GLUCOSE 126*  BUN 33*  CREATININE 2.17*  CALCIUM 9.2   Liver Function Tests: Recent Labs  Lab 03/13/23 0030  AST 24  ALT 13  ALKPHOS 113  BILITOT 0.4  PROT 7.2  ALBUMIN 3.0*   No results for input(s): "LIPASE", "AMYLASE" in the last 168 hours. No results for input(s): "AMMONIA" in the last 168 hours. Coagulation Profile: No results for input(s): "INR", "PROTIME" in the last 168 hours. CBC: Recent Labs  Lab 03/13/23 0030  WBC 3.9*  NEUTROABS 2.2  HGB 5.3*  HCT 17.7*  MCV 77.6*  PLT 155   Cardiac Enzymes: No results for input(s): "CKTOTAL", "CKMB", "CKMBINDEX", "TROPONINI" in the last 168 hours. BNP: Invalid input(s): "POCBNP" CBG: No results for input(s): "GLUCAP" in the last 168 hours. HbA1C: No results for input(s): "HGBA1C" in the last 72 hours. Urine analysis:    Component Value Date/Time   COLORURINE YELLOW 07/21/2021 1529   APPEARANCEUR CLOUDY (A) 07/21/2021 1529   LABSPEC 1.014 07/21/2021 1529   PHURINE 5.0 07/21/2021 1529   GLUCOSEU NEGATIVE 07/21/2021 1529   HGBUR MODERATE (A) 07/21/2021 1529   BILIRUBINUR NEGATIVE 07/21/2021 1529    KETONESUR NEGATIVE 07/21/2021 1529   PROTEINUR 100 (A) 07/21/2021 1529   NITRITE NEGATIVE 07/21/2021 1529   LEUKOCYTESUR NEGATIVE 07/21/2021 1529   Sepsis Labs: @LABRCNTIP (procalcitonin:4,lacticidven:4) )No results found for this or any previous visit (from the past 240 hour(s)).   Scheduled Meds:  sodium chloride   Intravenous Once   amLODipine  5 mg Oral Daily   feeding supplement  237 mL Oral BID BM   fesoterodine  4 mg Oral Daily   mirabegron ER  50 mg Oral Daily   pantoprazole (PROTONIX) IV  40 mg Intravenous Q12H   Continuous Infusions:  Procedures/Studies: No results found.  Catarina Hartshorn, DO  [facility]  If 7PM-7AM, please contact night-coverage [email or web address] Password [email or web address] 03/13/2023, 9:00 AM   LOS: 0 days

## 2023-03-13 NOTE — H&P (View-Only) (Signed)
Consulting  Provider: Dr. Arbutus Leas Primary Care Physician:  Zoila Shutter, MD Primary Gastroenterologist:  [facility]  Reason for Consultation:  Rectal bleeding, acute blood loss anemia  HPI:  Carlos Rodriguez is a 77 y.o. 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  Routine follow-up from hospitalization in April with PCP found to have hemoglobin of 5.6 and sent to emergency room.  Has been transfused 2 units of PRBCs.  Repeat hemoglobin pending.  Hemodynamically stable.  Patient notes feeling improved after receiving blood.  Previous hospitalization at [facility] in April for hematochezia. Colonoscopy 01/15/23 revealed radiation proctitis with active bleeding that was treated with APC, diverticulosis in the ascending, descending, and sigmoid colons.  Sigmoidoscopy on 04/23/22 revealed multiple small diverticula in the sigmoid colon, severe, patchy friable mucosa in the rectum, diffuse angioectasias c/w radiation proctopathy. He has never had a full colonoscopy.   Patient does note chronic constipation.  States he has been straining as of late.  Notes intermittent bleeding since previous discharge.  This is progressively becoming more frequent, bright red blood.  No rectal pain.  Occasional abdominal pain.  Past Medical History:  Diagnosis Date   Asthma    CKD (chronic kidney disease)    HTN (hypertension)    Personal history of DVT (deep vein thrombosis)    and PE   Prostate cancer (HCC)    Sarcoidosis    Stiffman syndrome     Past Surgical History:  Procedure Laterality Date   CARDIAC SURGERY     COLONOSCOPY WITH PROPOFOL N/A 07/24/2021   Procedure:  COLONOSCOPY WITH PROPOFOL;  Surgeon: Lanelle Bal, DO;  [facility];  Service: Endoscopy;  Laterality: N/A;   POLYPECTOMY  07/24/2021   Procedure: POLYPECTOMY;  Surgeon: Lanelle Bal, DO;  [facility];  Service: Endoscopy;;   right rotator cuff     ROBOT ASSISTED LAPAROSCOPIC RADICAL PROSTATECTOMY     SHOULDER SURGERY     Right shoulder   TUMOR REMOVAL      Prior to Admission medications   Medication Sig Start Date End Date Taking? Authorizing Provider  acetaminophen (TYLENOL) 325 MG tablet Take 2 tablets (650 mg total) by mouth every 6 (six) hours as needed for mild pain, headache or fever (or Fever >/= 101). 07/25/21   Shon Hale, MD  amLODipine (NORVASC) 5 MG tablet Take 1 tablet (5 mg total) by mouth daily. For BP 07/26/21   Emokpae, Courage, MD  Ascorbic Acid (VITAMIN C) 1000 MG tablet Take by mouth. Patient not taking: Reported on 07/21/2021    [provider]  augmented betamethasone dipropionate (DIPROLENE-AF) 0.05 % ointment Apply topically. Patient not taking: Reported on 07/21/2021 02/03/21   [provider]  citalopram (CELEXA) 20 MG tablet Take 20 mg by mouth daily. Patient not taking: Reported on 07/21/2021 04/12/21   [provider]  enoxaparin (LOVENOX) 60 MG/0.6ML injection Inject 60 mg into the skin daily. 04/18/19   [provider]  Fluticasone Propionate, Inhal, (FLOVENT DISKUS) 100 MCG/BLIST AEPB Inhale into the lungs. Patient not taking: Reported on 07/21/2021    [provider]  furosemide (LASIX) 20 MG tablet Take by mouth. Patient not taking: Reported on 07/21/2021 02/16/20  [provider]  methocarbamol (ROBAXIN) 500 MG tablet Take 1 tablet (500 mg total) by mouth 3 (three) times daily. For Muscle Pain/Spasm 07/25/21   Emokpae, Courage, MD  MYRBETRIQ 50 MG TB24 tablet Take 50 mg by mouth daily. 07/16/21   [provider]  omeprazole (PRILOSEC) 20 MG capsule Take 1  capsule (20 mg total) by mouth daily. 07/25/21   Shon Hale, MD  ondansetron (ZOFRAN-ODT) 4 MG disintegrating tablet 4mg  ODT q4 hours prn nausea/vomit 08/27/22   Mesner, Barbara Cower, MD  oxyCODONE (OXY IR/ROXICODONE) 5 MG immediate release tablet Take by mouth. 07/11/21   [provider]  polyethylene glycol (MIRALAX / GLYCOLAX) 17 g packet Take 17 g by mouth 2 (two) times daily. 07/25/21   Shon Hale, MD  solifenacin (VESICARE) 10 MG tablet Take 1 tablet by mouth daily. 06/27/21   [provider]    Current Facility-Administered Medications  Medication Dose Route Frequency Provider Last Rate Last Admin   0.9 %  sodium chloride infusion (Manually program via Guardrails IV Fluids)   Intravenous Once Adefeso, Oladapo, DO       amLODipine (NORVASC) tablet 5 mg  5 mg Oral Daily Adefeso, Oladapo, DO   5 mg at 03/13/23 0831   feeding supplement (ENSURE ENLIVE / ENSURE PLUS) liquid 237 mL  237 mL Oral BID BM Adefeso, Oladapo, DO       pantoprazole (PROTONIX) injection 40 mg  40 mg Intravenous Q12H Adefeso, Oladapo, DO   40 mg at 03/13/23 0831   sertraline (ZOLOFT) tablet 100 mg  100 mg Oral Daily Tat, David, MD   100 mg at 03/13/23 0956    Allergies as of 03/12/2023 - Review Complete 08/27/2022  Allergen Reaction Noted   Dilaudid [hydromorphone] Shortness Of Breath 07/21/2021    Family History  Problem Relation Age of Onset   Colon cancer Neg Hx    Colon polyps Neg Hx     Social History   Socioeconomic History   Marital status: Married    Spouse name: Not on file   Number of children: Not on file   Years of education: Not on file   Highest education level: Not on file  Occupational History   Not on file  Tobacco Use   Smoking status: Never   Smokeless tobacco: Never  Vaping Use   Vaping Use: Never used  Substance and Sexual Activity   Alcohol use: Not Currently    Comment: occasionaly   Drug use: Not Currently   Sexual activity: Yes  Other Topics Concern    Not on file  Social History Narrative   Not on file   Social Determinants of Health   Financial Resource Strain: Not on file  Food Insecurity: No Food Insecurity (03/13/2023)   Hunger Vital Sign    Worried About Running Out of Food in the Last Year: Never true    Ran Out of Food in the Last Year: Never true  Transportation Needs: No Transportation Needs (03/13/2023)   PRAPARE - Administrator, Civil Service (Medical): No    Lack of Transportation (Non-Medical): No  Physical Activity: Not on file  Stress: Not on file  Social Connections: Not on file  Intimate Partner Violence: Not At Risk (03/13/2023)   Humiliation, Afraid, Rape, and Kick questionnaire    Fear of Current or Ex-Partner: No    Emotionally Abused: No    Physically Abused: No    Sexually Abused: No    Review of Systems: General:  Negative for anorexia, weight loss, fever, chills, fatigue, weakness. Eyes: Negative for vision changes.  ENT: Negative for hoarseness, difficulty swallowing , nasal congestion. CV: Negative for chest pain, angina, palpitations, dyspnea on exertion, peripheral edema.  Respiratory: Negative for dyspnea at rest, dyspnea on exertion, cough, sputum, wheezing.  GI: See history of present illness. GU:  Negative for dysuria, hematuria, urinary incontinence, urinary frequency, nocturnal urination.  MS: Negative for joint pain, low back pain.  Derm: Negative for rash or itching.  Neuro: Negative for weakness, abnormal sensation, seizure, frequent headaches, memory loss, confusion.  Psych: Negative for anxiety, depression Endo: Negative for unusual weight change.  Heme: Negative for bruising or bleeding. Allergy: Negative for rash or hives.  Physical Exam: Vital signs in last 24 hours: Temp:  [98.1 F (36.7 C)-99.3 F (37.4 C)] 98.3 F (36.8 C) (06/01 0842) Pulse Rate:  [60-72] 63 (06/01 0842) Resp:  [14-18] 18 (06/01 0842) BP: (112-135)/(72-80) 135/80 (06/01 0842) SpO2:  [99 %-100  %] 100 % (06/01 0831) Weight:  [62.6 kg-66.4 kg] 66.4 kg (06/01 0247)   General:   Alert,  Well-developed, well-nourished, pleasant and cooperative in NAD Head:  Normocephalic and atraumatic. Eyes:  Sclera clear, no icterus.   Conjunctiva pink. Ears:  Normal auditory acuity. Nose:  No deformity, discharge,  or lesions. Mouth:  No deformity or lesions, dentition normal. Neck:  Supple; no masses or thyromegaly. Lungs:  Clear throughout to auscultation.   No wheezes, crackles, or rhonchi. No acute distress. Heart:  Regular rate and rhythm; no murmurs, clicks, rubs,  or gallops. Abdomen:  Soft, nontender and nondistended. No masses, hepatosplenomegaly or hernias noted. Normal bowel sounds, without guarding, and without rebound.   Msk:  Symmetrical without gross deformities. Normal posture. Pulses:  Normal pulses noted. Extremities:  Without clubbing or edema. Neurologic:  Alert and  oriented x4;  grossly normal neurologically. Skin:  Intact without significant lesions or rashes. Cervical Nodes:  No significant cervical adenopathy. Psych:  Alert and cooperative. Normal mood and affect.  Intake/Output from previous day: 05/31 0701 - 06/01 0700 In: 352 [Blood:352] Out: -  Intake/Output this shift: No intake/output data recorded.  Lab Results: Recent Labs    03/13/23 0030  WBC 3.9*  HGB 5.3*  HCT 17.7*  PLT 155   BMET Recent Labs    03/13/23 0030  NA 134*  K 4.0  CL 108  CO2 19*  GLUCOSE 126*  BUN 33*  CREATININE 2.17*  CALCIUM 9.2   LFT Recent Labs    03/13/23 0030  PROT 7.2  ALBUMIN 3.0*  AST 24  ALT 13  ALKPHOS 113  BILITOT 0.4   PT/INR No results for input(s): "LABPROT", "INR" in the last 72 hours. Hepatitis Panel No results for input(s): "HEPBSAG", "HCVAB", "HEPAIGM", "HEPBIGM" in the last 72 hours. C-Diff No results for input(s): "CDIFFTOX" in the last 72 hours.  Studies/Results: No results found.  Assessment: *Hematochezia *Acute blood loss  anemia *Radiation proctitis *Stercoral colitis  Plan: Agree with transfusing patient 2 units PRBCs.  Continue to monitor H&H and transfuse for less than 7.  Will plan on flexible sigmoidoscopy to further evaluate tomorrow morning.  May need repeat APC treatment.  The risks including infection, bleed, or perforation as well as benefits, limitations, alternatives and imponderables have been reviewed with the patient. Questions have been answered. All parties agreeable.  Further recommendations after endoscopic evaluation.   Hennie Duos. Marletta Lor, D.O. Gastroenterology and Hepatology [facility]    LOS: 0 days  03/13/2023, 10:41 AM

## 2023-03-13 NOTE — H&P (Signed)
History and Physical    PatientKoray Rodriguez UUV:[id number] DOB: 1946-04-25 DOA: 03/12/2023 DOS: the patient was seen and examined on 03/13/2023 PCP: Zoila Shutter, MD  Patient coming from: Home  Chief Complaint:  Chief Complaint  Patient presents with   abnormal labs   HPI: Carlos Rodriguez is a 77 y.o. male with medical history significant of essential hypertension, history of DVT and PE, GERD, prostate cancer status post radiotherapy, right AKA who presents to the emergency due to low hemoglobin.  Patient had a blood work done by his PCP yesterday and was called by his doctor in the evening to go to an ER for blood transfusion due to hemoglobin of 5.6.  Patient complained of intermittent rectal bleed s/p radiation therapy for prostate cancer.  He endorsed colonoscopy about 3 weeks ago done at [facility] and he states that he has been having intermittent bleeding since the procedure and that the bleeding was worse within the last few days.  Patient endorsed bright red blood per rectum last night.  He denies chest pain, shortness of breath, fever, chills, nausea, vomiting, abdominal pain.  ED Course:  In the emergency department, he was hemodynamically stable.  Workup in the ED showed WBC 3.9, H/H5.3/17.7 (this was 9.5/29.6 on 08/27/2022), MCV 77.6, 55.  BMP showed sodium 134, potassium 4.0, chloride 108, bicarb 19, blood glucose 126, BUN 33, creatinine 2.17 (creatinine is within baseline range), albumin 3.0 2 units of PRBC was ordered to be transfused Hospitalist was asked to admit patient for further evaluation and management.  Review of Systems: Review of systems as noted in the HPI. All other systems reviewed and are negative.   Past Medical History:  Diagnosis Date   Asthma    CKD (chronic kidney disease)    HTN (hypertension)    Personal history of DVT (deep vein thrombosis)    and PE   Prostate cancer (HCC)    Sarcoidosis    Stiffman syndrome    Past Surgical  History:  Procedure Laterality Date   CARDIAC SURGERY     COLONOSCOPY WITH PROPOFOL N/A 07/24/2021   Procedure: COLONOSCOPY WITH PROPOFOL;  Surgeon: Lanelle Bal, DO;  [facility];  Service: Endoscopy;  Laterality: N/A;   POLYPECTOMY  07/24/2021   Procedure: POLYPECTOMY;  Surgeon: Lanelle Bal, DO;  Location: [facility];  Service: Endoscopy;;   right rotator cuff     ROBOT ASSISTED LAPAROSCOPIC RADICAL PROSTATECTOMY     SHOULDER SURGERY     Right shoulder   TUMOR REMOVAL      Social History:  reports that he has never smoked. He has never used smokeless tobacco. He reports that he does not currently use alcohol. He reports that he does not currently use drugs.   Allergies  Allergen Reactions   Dilaudid [Hydromorphone] Shortness Of Breath   Lyrica [Pregabalin] Other (See Comments)    Causes AMS.    Family History  Problem Relation Age of Onset   Colon cancer Neg Hx    Colon polyps Neg Hx      Prior to Admission medications   Medication Sig Start Date End Date Taking? Authorizing Provider  acetaminophen (TYLENOL) 325 MG tablet Take 2 tablets (650 mg total) by mouth every 6 (six) hours as needed for mild pain, headache or fever (or Fever >/= 101). 07/25/21   Shon Hale, MD  amLODipine (NORVASC) 5 MG tablet Take 1 tablet (5 mg total) by mouth daily. For BP 07/26/21  Shon Hale, MD  Ascorbic Acid (VITAMIN C) 1000 MG tablet Take by mouth. Patient not taking: Reported on 07/21/2021    [provider]  augmented betamethasone dipropionate (DIPROLENE-AF) 0.05 % ointment Apply topically. Patient not taking: Reported on 07/21/2021 02/03/21   [provider]  citalopram (CELEXA) 20 MG tablet Take 20 mg by mouth daily. Patient not taking: Reported on 07/21/2021 04/12/21   [provider]  enoxaparin (LOVENOX) 60 MG/0.6ML injection Inject 60 mg into the skin daily. 04/18/19   [provider]  Fluticasone Propionate,  Inhal, (FLOVENT DISKUS) 100 MCG/BLIST AEPB Inhale into the lungs. Patient not taking: Reported on 07/21/2021    [provider]  furosemide (LASIX) 20 MG tablet Take by mouth. Patient not taking: Reported on 07/21/2021 02/16/20   [provider]  methocarbamol (ROBAXIN) 500 MG tablet Take 1 tablet (500 mg total) by mouth 3 (three) times daily. For Muscle Pain/Spasm 07/25/21   Emokpae, Courage, MD  MYRBETRIQ 50 MG TB24 tablet Take 50 mg by mouth daily. 07/16/21   [provider]  omeprazole (PRILOSEC) 20 MG capsule Take 1 capsule (20 mg total) by mouth daily. 07/25/21   Shon Hale, MD  ondansetron (ZOFRAN-ODT) 4 MG disintegrating tablet 4mg  ODT q4 hours prn nausea/vomit 08/27/22   Mesner, Barbara Cower, MD  oxyCODONE (OXY IR/ROXICODONE) 5 MG immediate release tablet Take by mouth. 07/11/21   [provider]  polyethylene glycol (MIRALAX / GLYCOLAX) 17 g packet Take 17 g by mouth 2 (two) times daily. 07/25/21   Shon Hale, MD  solifenacin (VESICARE) 10 MG tablet Take 1 tablet by mouth daily. 06/27/21   [provider]    Physical Exam: BP 129/73 (BP Location: Right Arm)   Pulse 65   Temp 99.1 F (37.3 C) (Oral)   Resp 17   Ht 5\' 9"  (1.753 m)   Wt 66.4 kg   SpO2 100%   BMI 21.62 kg/m   General: 77 y.o. year-old male well developed well nourished in no acute distress.  Alert and oriented x3. HEENT: NCAT, EOMI, pale conjunctiva Neck: Supple, trachea medial Cardiovascular: Regular rate and rhythm with no rubs or gallops.  No thyromegaly or JVD noted.  +2 left lower extremity edema. 2/4 pulses in all 4 extremities. Respiratory: Clear to auscultation with no wheezes or rales. Good inspiratory effort. Abdomen: Soft, nontender nondistended with normal bowel sounds x4 quadrants. Muskuloskeletal: Right AKA.  No cyanosis, clubbing noted bilaterally Neuro: CN II-XII intact, strength 5/5 x 4, sensation, reflexes intact Skin: No ulcerative lesions noted or  rashes Psychiatry: Judgement and insight appear normal. Mood is appropriate for condition and setting          Labs on Admission:  Basic Metabolic Panel: Recent Labs  Lab 03/13/23 0030  NA 134*  K 4.0  CL 108  CO2 19*  GLUCOSE 126*  BUN 33*  CREATININE 2.17*  CALCIUM 9.2   Liver Function Tests: Recent Labs  Lab 03/13/23 0030  AST 24  ALT 13  ALKPHOS 113  BILITOT 0.4  PROT 7.2  ALBUMIN 3.0*   No results for input(s): "LIPASE", "AMYLASE" in the last 168 hours. No results for input(s): "AMMONIA" in the last 168 hours. CBC: Recent Labs  Lab 03/13/23 0030  WBC 3.9*  NEUTROABS 2.2  HGB 5.3*  HCT 17.7*  MCV 77.6*  PLT 155   Cardiac Enzymes: No results for input(s): "CKTOTAL", "CKMB", "CKMBINDEX", "TROPONINI" in the last 168 hours.  BNP (last 3 results) No results for input(s): "  BNP" in the last 8760 hours.  ProBNP (last 3 results) No results for input(s): "PROBNP" in the last 8760 hours.  CBG: No results for input(s): "GLUCAP" in the last 168 hours.  Radiological Exams on Admission: No results found.  EKG: I independently viewed the EKG done and my findings are as followed: Normal sinus rhythm at a rate of 67 bpm  Assessment/Plan Present on Admission:  GI bleed  Essential hypertension  History of pulmonary embolus (PE)  Principal Problem:   GI bleed Active Problems:   History of pulmonary embolus (PE)   Essential hypertension   Acute blood loss anemia   Low mean corpuscular volume (MCV)   Iron deficiency anemia   Chronic kidney disease, stage 3b (HCC)   Overactive bladder   GERD (gastroesophageal reflux disease)   History of prostate cancer  Acute on chronic GI bleed Acute blood loss anemia H/H5.3/17.7 (this was 9.5/29.6 on 08/27/2022) Patient endorsed that he has been having intermittent rectal bleed within the last 2 months and this worsened within the last few days 2 units of PRBC was ordered to be transfused in the ED Continue IV  Protonix 40 mg twice daily Gastroenterologist  will be consulted in the morning  Low MCV Iron deficiency anemia MCV 77.6 Iron studies will be done  Leukopenia WBC 3.9, continue to monitor WBC  Hypoalbuminemia possibly secondary to moderate protein calorie malnutrition Albumin 3.0, protein supplement will be provided  Chronic kidney disease 3B Creatinine 2.17 (creatinine is within baseline range) Renally adjust medications, avoid nephrotoxic agents/dehydration/hypotension   Essential hypertension-controlled Continue amlodipine  GERD Continue Protonix  Overactive bladder Continue Myrbetriq, Toviaz  History of DVT/PE Patient was no longer on Lovenox  History of prostate cancer Patient was no longer having radiotherapy He follows with [facility] oncology  DVT prophylaxis: SCD  Advance Care Planning: Full Code  Consults: Gastroenterology  Family Communication: None at bedside  Severity of Illness: The appropriate patient status for this patient is OBSERVATION. Observation status is judged to be reasonable and necessary in order to provide the required intensity of service to ensure the patient's safety. The patient's presenting symptoms, physical exam findings, and initial radiographic and laboratory data in the context of their medical condition is felt to place them at decreased risk for further clinical deterioration. Furthermore, it is anticipated that the patient will be medically stable for discharge from the hospital within 2 midnights of admission.   Author: Frankey Shown, DO 03/13/2023 2:56 AM  For on call review [email or web address]. Full code

## 2023-03-13 NOTE — ED Notes (Signed)
Critical HGB 5.3. MD made aware.

## 2023-03-13 NOTE — Consult Note (Signed)
Consulting  Provider: Dr. Tat Primary Care Physician:  Woodyear, Wynne E, MD Primary Gastroenterologist:  [facility]  Reason for Consultation:  Rectal bleeding, acute blood loss anemia  HPI:  Carlos Rodriguez is a 77 y.o. 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  Routine follow-up from hospitalization in April with PCP found to have hemoglobin of 5.6 and sent to emergency room.  Has been transfused 2 units of PRBCs.  Repeat hemoglobin pending.  Hemodynamically stable.  Patient notes feeling improved after receiving blood.  Previous hospitalization at [facility] in April for hematochezia. Colonoscopy 01/15/23 revealed radiation proctitis with active bleeding that was treated with APC, diverticulosis in the ascending, descending, and sigmoid colons.  Sigmoidoscopy on 04/23/22 revealed multiple small diverticula in the sigmoid colon, severe, patchy friable mucosa in the rectum, diffuse angioectasias c/w radiation proctopathy. He has never had a full colonoscopy.   Patient does note chronic constipation.  States he has been straining as of late.  Notes intermittent bleeding since previous discharge.  This is progressively becoming more frequent, bright red blood.  No rectal pain.  Occasional abdominal pain.  Past Medical History:  Diagnosis Date   Asthma    CKD (chronic kidney disease)    HTN (hypertension)    Personal history of DVT (deep vein thrombosis)    and PE   Prostate cancer (HCC)    Sarcoidosis    Stiffman syndrome     Past Surgical History:  Procedure Laterality Date   CARDIAC SURGERY     COLONOSCOPY WITH PROPOFOL N/A 07/24/2021   Procedure:  COLONOSCOPY WITH PROPOFOL;  Surgeon: Candra Wegner K, DO;  Location: [facility];  Service: Endoscopy;  Laterality: N/A;   POLYPECTOMY  07/24/2021   Procedure: POLYPECTOMY;  Surgeon: Marlen Mollica K, DO;  Location: [facility];  Service: Endoscopy;;   right rotator cuff     ROBOT ASSISTED LAPAROSCOPIC RADICAL PROSTATECTOMY     SHOULDER SURGERY     Right shoulder   TUMOR REMOVAL      Prior to Admission medications   Medication Sig Start Date End Date Taking? Authorizing Provider  acetaminophen (TYLENOL) 325 MG tablet Take 2 tablets (650 mg total) by mouth every 6 (six) hours as needed for mild pain, headache or fever (or Fever >/= 101). 07/25/21   Emokpae, Courage, MD  amLODipine (NORVASC) 5 MG tablet Take 1 tablet (5 mg total) by mouth daily. For BP 07/26/21   Emokpae, Courage, MD  Ascorbic Acid (VITAMIN C) 1000 MG tablet Take by mouth. Patient not taking: Reported on 07/21/2021    [provider]  augmented betamethasone dipropionate (DIPROLENE-AF) 0.05 % ointment Apply topically. Patient not taking: Reported on 07/21/2021 02/03/21   [provider]  citalopram (CELEXA) 20 MG tablet Take 20 mg by mouth daily. Patient not taking: Reported on 07/21/2021 04/12/21   [provider]  enoxaparin (LOVENOX) 60 MG/0.6ML injection Inject 60 mg into the skin daily. 04/18/19   [provider]  Fluticasone Propionate, Inhal, (FLOVENT DISKUS) 100 MCG/BLIST AEPB Inhale into the lungs. Patient not taking: Reported on 07/21/2021    [provider]  furosemide (LASIX) 20 MG tablet Take by mouth. Patient not taking: Reported on 07/21/2021 02/16/20     [provider]  methocarbamol (ROBAXIN) 500 MG tablet Take 1 tablet (500 mg total) by mouth 3 (three) times daily. For Muscle Pain/Spasm 07/25/21   Emokpae, Courage, MD  MYRBETRIQ 50 MG TB24 tablet Take 50 mg by mouth daily. 07/16/21   [provider]  omeprazole (PRILOSEC) 20 MG capsule Take 1  capsule (20 mg total) by mouth daily. 07/25/21   Emokpae, Courage, MD  ondansetron (ZOFRAN-ODT) 4 MG disintegrating tablet 4mg ODT q4 hours prn nausea/vomit 08/27/22   Mesner, Jason, MD  oxyCODONE (OXY IR/ROXICODONE) 5 MG immediate release tablet Take by mouth. 07/11/21   [provider]  polyethylene glycol (MIRALAX / GLYCOLAX) 17 g packet Take 17 g by mouth 2 (two) times daily. 07/25/21   Emokpae, Courage, MD  solifenacin (VESICARE) 10 MG tablet Take 1 tablet by mouth daily. 06/27/21   [provider]    Current Facility-Administered Medications  Medication Dose Route Frequency Provider Last Rate Last Admin   0.9 %  sodium chloride infusion (Manually program via Guardrails IV Fluids)   Intravenous Once Adefeso, Oladapo, DO       amLODipine (NORVASC) tablet 5 mg  5 mg Oral Daily Adefeso, Oladapo, DO   5 mg at 03/13/23 0831   feeding supplement (ENSURE ENLIVE / ENSURE PLUS) liquid 237 mL  237 mL Oral BID BM Adefeso, Oladapo, DO       pantoprazole (PROTONIX) injection 40 mg  40 mg Intravenous Q12H Adefeso, Oladapo, DO   40 mg at 03/13/23 0831   sertraline (ZOLOFT) tablet 100 mg  100 mg Oral Daily Tat, David, MD   100 mg at 03/13/23 0956    Allergies as of 03/12/2023 - Review Complete 08/27/2022  Allergen Reaction Noted   Dilaudid [hydromorphone] Shortness Of Breath 07/21/2021    Family History  Problem Relation Age of Onset   Colon cancer Neg Hx    Colon polyps Neg Hx     Social History   Socioeconomic History   Marital status: Married    Spouse name: Not on file   Number of children: Not on file   Years of education: Not on file   Highest education level: Not on file  Occupational History   Not on file  Tobacco Use   Smoking status: Never   Smokeless tobacco: Never  Vaping Use   Vaping Use: Never used  Substance and Sexual Activity   Alcohol use: Not Currently    Comment: occasionaly   Drug use: Not Currently   Sexual activity: Yes  Other Topics Concern    Not on file  Social History Narrative   Not on file   Social Determinants of Health   Financial Resource Strain: Not on file  Food Insecurity: No Food Insecurity (03/13/2023)   Hunger Vital Sign    Worried About Running Out of Food in the Last Year: Never true    Ran Out of Food in the Last Year: Never true  Transportation Needs: No Transportation Needs (03/13/2023)   PRAPARE - Transportation    Lack of Transportation (Medical): No    Lack of Transportation (Non-Medical): No  Physical Activity: Not on file  Stress: Not on file  Social Connections: Not on file  Intimate Partner Violence: Not At Risk (03/13/2023)   Humiliation, Afraid, Rape, and Kick questionnaire    Fear of Current or Ex-Partner: No    Emotionally Abused: No    Physically Abused: No    Sexually Abused: No    Review of Systems: General:   Negative for anorexia, weight loss, fever, chills, fatigue, weakness. Eyes: Negative for vision changes.  ENT: Negative for hoarseness, difficulty swallowing , nasal congestion. CV: Negative for chest pain, angina, palpitations, dyspnea on exertion, peripheral edema.  Respiratory: Negative for dyspnea at rest, dyspnea on exertion, cough, sputum, wheezing.  GI: See history of present illness. GU:  Negative for dysuria, hematuria, urinary incontinence, urinary frequency, nocturnal urination.  MS: Negative for joint pain, low back pain.  Derm: Negative for rash or itching.  Neuro: Negative for weakness, abnormal sensation, seizure, frequent headaches, memory loss, confusion.  Psych: Negative for anxiety, depression Endo: Negative for unusual weight change.  Heme: Negative for bruising or bleeding. Allergy: Negative for rash or hives.  Physical Exam: Vital signs in last 24 hours: Temp:  [98.1 F (36.7 C)-99.3 F (37.4 C)] 98.3 F (36.8 C) (06/01 0842) Pulse Rate:  [60-72] 63 (06/01 0842) Resp:  [14-18] 18 (06/01 0842) BP: (112-135)/(72-80) 135/80 (06/01 0842) SpO2:  [99 %-100  %] 100 % (06/01 0831) Weight:  [62.6 kg-66.4 kg] 66.4 kg (06/01 0247)   General:   Alert,  Well-developed, well-nourished, pleasant and cooperative in NAD Head:  Normocephalic and atraumatic. Eyes:  Sclera clear, no icterus.   Conjunctiva pink. Ears:  Normal auditory acuity. Nose:  No deformity, discharge,  or lesions. Mouth:  No deformity or lesions, dentition normal. Neck:  Supple; no masses or thyromegaly. Lungs:  Clear throughout to auscultation.   No wheezes, crackles, or rhonchi. No acute distress. Heart:  Regular rate and rhythm; no murmurs, clicks, rubs,  or gallops. Abdomen:  Soft, nontender and nondistended. No masses, hepatosplenomegaly or hernias noted. Normal bowel sounds, without guarding, and without rebound.   Msk:  Symmetrical without gross deformities. Normal posture. Pulses:  Normal pulses noted. Extremities:  Without clubbing or edema. Neurologic:  Alert and  oriented x4;  grossly normal neurologically. Skin:  Intact without significant lesions or rashes. Cervical Nodes:  No significant cervical adenopathy. Psych:  Alert and cooperative. Normal mood and affect.  Intake/Output from previous day: 05/31 0701 - 06/01 0700 In: 352 [Blood:352] Out: -  Intake/Output this shift: No intake/output data recorded.  Lab Results: Recent Labs    03/13/23 0030  WBC 3.9*  HGB 5.3*  HCT 17.7*  PLT 155   BMET Recent Labs    03/13/23 0030  NA 134*  K 4.0  CL 108  CO2 19*  GLUCOSE 126*  BUN 33*  CREATININE 2.17*  CALCIUM 9.2   LFT Recent Labs    03/13/23 0030  PROT 7.2  ALBUMIN 3.0*  AST 24  ALT 13  ALKPHOS 113  BILITOT 0.4   PT/INR No results for input(s): "LABPROT", "INR" in the last 72 hours. Hepatitis Panel No results for input(s): "HEPBSAG", "HCVAB", "HEPAIGM", "HEPBIGM" in the last 72 hours. C-Diff No results for input(s): "CDIFFTOX" in the last 72 hours.  Studies/Results: No results found.  Assessment: *Hematochezia *Acute blood loss  anemia *Radiation proctitis *Stercoral colitis  Plan: Agree with transfusing patient 2 units PRBCs.  Continue to monitor H&H and transfuse for less than 7.  Will plan on flexible sigmoidoscopy to further evaluate tomorrow morning.  May need repeat APC treatment.  The risks including infection, bleed, or perforation as well as benefits, limitations, alternatives and imponderables have been reviewed with the patient. Questions have been answered. All parties agreeable.  Further recommendations after endoscopic evaluation.   Sabri Teal K. Christyne Mccain, D.O. Gastroenterology and Hepatology [facility]    LOS: 0 days       03/13/2023, 10:41 AM    

## 2023-03-13 NOTE — ED Provider Notes (Signed)
[place] EMERGENCY DEPARTMENT AT [facility] Provider Note   CSN: [id number] Arrival date & time: 03/12/23  2348     History  Chief Complaint  Patient presents with   abnormal labs    Carlos Rodriguez is a 77 y.o. male.  Patient is a 77 year old male with past medical history of prostate cancer status post radiation therapy.  He also has history of asthma, hypertension, chronic renal insufficiency, and right lower extremity amputation.  Patient presenting today with complaints of low hemoglobin.  He was called by his doctor and told to come to the ER because his hemoglobin was 5.6.  Patient with history of blood transfusions in the past.  He tells me that he has episodic bleeding from his rectum that is the result of inflammation from prostate cancer treatment.  He recently had a colonoscopy 3 weeks ago performed at [facility].  He has had intermittent bleeding since, but seems to be worse over the past few days.  The history is provided by the patient.       Home Medications Prior to Admission medications   Medication Sig Start Date End Date Taking? Authorizing Provider  acetaminophen (TYLENOL) 325 MG tablet Take 2 tablets (650 mg total) by mouth every 6 (six) hours as needed for mild pain, headache or fever (or Fever >/= 101). 07/25/21   Shon Hale, MD  amLODipine (NORVASC) 5 MG tablet Take 1 tablet (5 mg total) by mouth daily. For BP 07/26/21   Emokpae, Courage, MD  Ascorbic Acid (VITAMIN C) 1000 MG tablet Take by mouth. Patient not taking: Reported on 07/21/2021    [provider]  augmented betamethasone dipropionate (DIPROLENE-AF) 0.05 % ointment Apply topically. Patient not taking: Reported on 07/21/2021 02/03/21   [provider]  citalopram (CELEXA) 20 MG tablet Take 20 mg by mouth daily. Patient not taking: Reported on 07/21/2021 04/12/21   [provider]  enoxaparin (LOVENOX) 60 MG/0.6ML injection Inject 60 mg into the skin daily.  04/18/19   [provider]  Fluticasone Propionate, Inhal, (FLOVENT DISKUS) 100 MCG/BLIST AEPB Inhale into the lungs. Patient not taking: Reported on 07/21/2021    [provider]  furosemide (LASIX) 20 MG tablet Take by mouth. Patient not taking: Reported on 07/21/2021 02/16/20   [provider]  methocarbamol (ROBAXIN) 500 MG tablet Take 1 tablet (500 mg total) by mouth 3 (three) times daily. For Muscle Pain/Spasm 07/25/21   Emokpae, Courage, MD  MYRBETRIQ 50 MG TB24 tablet Take 50 mg by mouth daily. 07/16/21   [provider]  omeprazole (PRILOSEC) 20 MG capsule Take 1 capsule (20 mg total) by mouth daily. 07/25/21   Shon Hale, MD  ondansetron (ZOFRAN-ODT) 4 MG disintegrating tablet 4mg  ODT q4 hours prn nausea/vomit 08/27/22   Mesner, Barbara Cower, MD  oxyCODONE (OXY IR/ROXICODONE) 5 MG immediate release tablet Take by mouth. 07/11/21   [provider]  polyethylene glycol (MIRALAX / GLYCOLAX) 17 g packet Take 17 g by mouth 2 (two) times daily. 07/25/21   Shon Hale, MD  solifenacin (VESICARE) 10 MG tablet Take 1 tablet by mouth daily. 06/27/21   [provider]      Allergies    Dilaudid [hydromorphone] and Lyrica [pregabalin]    Review of Systems   Review of Systems  All other systems reviewed and are negative.   Physical Exam Updated Vital Signs BP 121/74 (BP Location: Right Arm)   Pulse 64   Temp 99.3 F (37.4 C) (Oral)   Resp  17   Ht 5\' 9"  (1.753 m)   Wt 62.6 kg   SpO2 100%   BMI 20.38 kg/m  Physical Exam Vitals and nursing note reviewed.  Constitutional:      General: He is not in acute distress.    Appearance: He is well-developed. He is not diaphoretic.  HENT:     Head: Normocephalic and atraumatic.  Cardiovascular:     Rate and Rhythm: Normal rate and regular rhythm.     Heart sounds: No murmur heard.    No friction rub.  Pulmonary:     Effort: Pulmonary effort is normal. No respiratory distress.      Breath sounds: Normal breath sounds. No wheezing or rales.  Abdominal:     General: Bowel sounds are normal. There is no distension.     Palpations: Abdomen is soft.     Tenderness: There is no abdominal tenderness.  Musculoskeletal:        General: Normal range of motion.     Cervical back: Normal range of motion and neck supple.  Skin:    General: Skin is warm and dry.  Neurological:     Mental Status: He is alert and oriented to person, place, and time.     Coordination: Coordination normal.     ED Results / Procedures / Treatments   Labs (all labs ordered are listed, but only abnormal results are displayed) Labs Reviewed  CBC WITH DIFFERENTIAL/PLATELET - Abnormal; Notable for the following components:      Result Value   WBC 3.9 (*)    RBC 2.28 (*)    Hemoglobin 5.3 (*)    HCT 17.7 (*)    MCV 77.6 (*)    MCH 23.2 (*)    MCHC 29.9 (*)    RDW 17.7 (*)    All other components within normal limits  COMPREHENSIVE METABOLIC PANEL - Abnormal; Notable for the following components:   Sodium 134 (*)    CO2 19 (*)    Glucose, Bld 126 (*)    BUN 33 (*)    Creatinine, Ser 2.17 (*)    Albumin 3.0 (*)    GFR, Estimated 31 (*)    All other components within normal limits  TYPE AND SCREEN  ABO/RH  PREPARE RBC (CROSSMATCH)    EKG None  Radiology No results found.  Procedures Procedures    Medications Ordered in ED Medications  0.9 %  sodium chloride infusion (Manually program via Guardrails IV Fluids) (has no administration in time range)  0.9 %  sodium chloride infusion (Manually program via Guardrails IV Fluids) (has no administration in time range)    ED Course/ Medical Decision Making/ A&P  Patient is a 77 year old male with history of prostate cancer status post radiation which has caused damage to his rectum and chronic blood loss.  He was sent here for a hemoglobin of 5.6.  He does report having more bleeding than normal with his bowel movements.  Patient  arrives here with stable vital signs.  He is chronically ill-appearing, but in no distress.  Abdomen is benign and other physical examination unremarkable.  Laboratory studies obtained including CBC showing hemoglobin of 5.3.  Metabolic panel showing creatinine of 2.17, but consistent with baseline.  Blood transfusion initiated in the ER.  Due to the increased bloody is noted in the stool and low hemoglobin, I feels the patient should be admitted for observation and blood transfusion.  I have spoken with the hospitalist who agrees to admit.  CRITICAL CARE Performed by: Geoffery Lyons Total critical care time: 35 minutes Critical care time was exclusive of separately billable procedures and treating other patients. Critical care was necessary to treat or prevent imminent or life-threatening deterioration. Critical care was time spent personally by me on the following activities: development of treatment plan with patient and/or surrogate as well as nursing, discussions with consultants, evaluation of patient's response to treatment, examination of patient, obtaining history from patient or surrogate, ordering and performing treatments and interventions, ordering and review of laboratory studies, ordering and review of radiographic studies, pulse oximetry and re-evaluation of patient's condition.   Final Clinical Impression(s) / ED Diagnoses Final diagnoses:  None    Rx / DC Orders ED Discharge Orders     None         Geoffery Lyons, MD 03/13/23 [id number]

## 2023-03-13 NOTE — Progress Notes (Signed)
At this time pt has received 1 of 2 units of PRBCs that is ordered d/t issue with lab being unable to release unit of blood. Lab states they will call when unit of PRBCs are ready for pick up. MD notified.

## 2023-03-14 ENCOUNTER — Telehealth: Payer: Self-pay | Admitting: Internal Medicine

## 2023-03-14 ENCOUNTER — Observation Stay (HOSPITAL_COMMUNITY): Payer: Medicare Other | Admitting: Anesthesiology

## 2023-03-14 ENCOUNTER — Encounter (HOSPITAL_COMMUNITY)
Admission: EM | Disposition: A | Discharge status: Home / Self Care | Payer: Self-pay | Source: Home / Self Care | Attending: Emergency Medicine

## 2023-03-14 DIAGNOSIS — K5730 Diverticulosis of large intestine without perforation or abscess without bleeding: Secondary | ICD-10-CM | POA: Diagnosis not present

## 2023-03-14 DIAGNOSIS — D500 Iron deficiency anemia secondary to blood loss (chronic): Secondary | ICD-10-CM | POA: Diagnosis not present

## 2023-03-14 DIAGNOSIS — K627 Radiation proctitis: Secondary | ICD-10-CM | POA: Diagnosis not present

## 2023-03-14 DIAGNOSIS — K921 Melena: Secondary | ICD-10-CM | POA: Diagnosis not present

## 2023-03-14 DIAGNOSIS — K648 Other hemorrhoids: Secondary | ICD-10-CM | POA: Diagnosis not present

## 2023-03-14 DIAGNOSIS — N1832 Chronic kidney disease, stage 3b: Secondary | ICD-10-CM | POA: Diagnosis not present

## 2023-03-14 DIAGNOSIS — K922 Gastrointestinal hemorrhage, unspecified: Secondary | ICD-10-CM | POA: Diagnosis not present

## 2023-03-14 DIAGNOSIS — K6289 Other specified diseases of anus and rectum: Secondary | ICD-10-CM | POA: Diagnosis not present

## 2023-03-14 HISTORY — PX: FLEXIBLE SIGMOIDOSCOPY: SHX5431

## 2023-03-14 LAB — BASIC METABOLIC PANEL
Anion gap: 6 (ref 5–15)
BUN: 28 mg/dL — ABNORMAL HIGH (ref 8–23)
CO2: 19 mmol/L — ABNORMAL LOW (ref 22–32)
Calcium: 9.2 mg/dL (ref 8.9–10.3)
Chloride: 109 mmol/L (ref 98–111)
Creatinine, Ser: 2.12 mg/dL — ABNORMAL HIGH (ref 0.61–1.24)
GFR, Estimated: 32 mL/min — ABNORMAL LOW (ref >60)
Glucose, Bld: 95 mg/dL (ref 70–99)
Potassium: 4 mmol/L (ref 3.5–5.1)
Sodium: 134 mmol/L — ABNORMAL LOW (ref 135–145)

## 2023-03-14 LAB — CBC
HCT: 24.6 % — ABNORMAL LOW (ref 39.0–52.0)
Hemoglobin: 7.9 g/dL — ABNORMAL LOW (ref 13.0–17.0)
MCH: 25.6 pg — ABNORMAL LOW (ref 26.0–34.0)
MCHC: 32.1 g/dL (ref 30.0–36.0)
MCV: 79.9 fL — ABNORMAL LOW (ref 80.0–100.0)
Platelets: 139 10*3/uL — ABNORMAL LOW (ref 150–400)
RBC: 3.08 MIL/uL — ABNORMAL LOW (ref 4.22–5.81)
RDW: 18.3 % — ABNORMAL HIGH (ref 11.5–15.5)
WBC: 3.9 10*3/uL — ABNORMAL LOW (ref 4.0–10.5)
nRBC: 0 % (ref 0.0–0.2)

## 2023-03-14 LAB — TYPE AND SCREEN
ABO/RH(D): O POS
Unit division: 0
Unit division: 0

## 2023-03-14 LAB — BPAM RBC
Blood Product Expiration Date: 202407062359
Blood Product Expiration Date: 202407062359
ISSUE DATE / TIME: 202406010147
ISSUE DATE / TIME: 202406010821
Unit Type and Rh: 5100

## 2023-03-14 SURGERY — SIGMOIDOSCOPY, FLEXIBLE
Anesthesia: General

## 2023-03-14 MED ORDER — PROPOFOL 10 MG/ML IV BOLUS
INTRAVENOUS | Status: AC
  Filled 2023-03-14: qty 20

## 2023-03-14 MED ORDER — POLYETHYLENE GLYCOL 3350 17 G PO PACK
17.0000 g | PACK | Freq: Two times a day (BID) | ORAL | Status: DC
  Administered 2023-03-14: 17 g via ORAL
  Filled 2023-03-14: qty 1

## 2023-03-14 MED ORDER — LACTATED RINGERS IV SOLN: INTRAVENOUS | Status: DC | PRN

## 2023-03-14 MED ORDER — MELATONIN 3 MG PO TABS: 4.5000 mg | ORAL_TABLET | Freq: Every day | ORAL | Status: DC

## 2023-03-14 MED ORDER — FERROUS SULFATE 325 (65 FE) MG PO TABS: 325.0000 mg | ORAL_TABLET | Freq: Every day | ORAL | Status: DC

## 2023-03-14 MED ORDER — PROPOFOL 10 MG/ML IV BOLUS
INTRAVENOUS | Status: DC | PRN
  Administered 2023-03-14: 200 mg via INTRAVENOUS

## 2023-03-14 MED ORDER — FERROUS SULFATE 325 (65 FE) MG PO TABS: 325.0000 mg | ORAL_TABLET | Freq: Every day | ORAL | 3 refills | Status: DC

## 2023-03-14 NOTE — Telephone Encounter (Signed)
Please arrange hospital f/u visit with Dr. Levon Hedger or APP. Patient also needs repeat CBC in 1 week. thank you

## 2023-03-14 NOTE — Interval H&P Note (Signed)
History and Physical Interval Note:  03/14/2023 9:21 AM  Carlos Rodriguez  has presented today for surgery, with the diagnosis of Hematochezia, acute blood loss anemia.  The various methods of treatment have been discussed with the patient and family. After consideration of risks, benefits and other options for treatment, the patient has consented to  Procedure(s): FLEXIBLE SIGMOIDOSCOPY (N/A) as a surgical intervention.  The patient's history has been reviewed, patient examined, no change in status, stable for surgery.  I have reviewed the patient's chart and labs.  Questions were answered to the patient's satisfaction.     Lanelle Bal

## 2023-03-14 NOTE — Anesthesia Preprocedure Evaluation (Signed)
Anesthesia Evaluation  Patient identified by MRN, date of birth, ID band Patient awake    Reviewed: Allergy & Precautions, H&P , NPO status , Patient's Chart, lab work & pertinent test results, reviewed documented beta blocker date and time   Airway Mallampati: II  TM Distance: >3 FB Neck ROM: full    Dental no notable dental hx.    Pulmonary neg pulmonary ROS, asthma , sleep apnea    Pulmonary exam normal breath sounds clear to auscultation       Cardiovascular Exercise Tolerance: Good hypertension, negative cardio ROS  Rhythm:regular Rate:Normal     Neuro/Psych  Neuromuscular disease CVA negative neurological ROS  negative psych ROS   GI/Hepatic negative GI ROS, Neg liver ROS,GERD  ,,  Endo/Other  negative endocrine ROS    Renal/GU Renal diseasenegative Renal ROS  negative genitourinary   Musculoskeletal   Abdominal   Peds  Hematology negative hematology ROS (+) Blood dyscrasia, anemia   Anesthesia Other Findings   Reproductive/Obstetrics negative OB ROS                             Anesthesia Physical Anesthesia Plan  ASA: 3  Anesthesia Plan: General   Post-op Pain Management:    Induction:   PONV Risk Score and Plan: Propofol infusion  Airway Management Planned:   Additional Equipment:   Intra-op Plan:   Post-operative Plan:   Informed Consent: I have reviewed the patients History and Physical, chart, labs and discussed the procedure including the risks, benefits and alternatives for the proposed anesthesia with the patient or authorized representative who has indicated his/her understanding and acceptance.     Dental Advisory Given  Plan Discussed with: CRNA  Anesthesia Plan Comments:        Anesthesia Quick Evaluation

## 2023-03-14 NOTE — Op Note (Signed)
[facility] Patient Name: Carlos Rodriguez Procedure Date: 03/14/2023 9:19 AM MRN: [id number] Date of Birth: 1946/07/14 Attending MD: Hennie Duos. Marletta Lor , [place], [phone] CSN: [id number] Age: 77 Admit Type: Inpatient Procedure:                Flexible Sigmoidoscopy Indications:              Hematochezia Providers:                Hennie Duos. Marletta Lor, DO, Nena Polio, RN, Durwin Glaze Tech, Technician Referring MD:              Medicines:                See the Anesthesia note for documentation of the                            administered medications Complications:            No immediate complications. Estimated Blood Loss:     Estimated blood loss was minimal. Procedure:                Pre-Anesthesia Assessment:                           - The anesthesia plan was to use monitored                            anesthesia care (MAC).                           After obtaining informed consent, the scope was                            passed under direct vision. The PCF-HQ190L                            ([id number]) scope was introduced through the anus and                            advanced to the the descending colon. The flexible                            sigmoidoscopy was accomplished without difficulty.                            The patient tolerated the procedure well. The                            quality of the bowel preparation was good. Scope In: 10:03:56 AM Scope Out: 10:13:16 AM Total Procedure Duration: 0 hours 9 minutes 20 seconds  Findings:      Non-bleeding internal hemorrhoids were found during endoscopy.      Multiple large-mouthed and small-mouthed diverticula were found in the       sigmoid colon.      Red blood was found in the recto-sigmoid colon.  Localized moderate inflammation characterized by congestion (edema),       erythema and friability was found in the rectum. 3 areas found that were       actively oozing blood.  Coagulation for hemostasis using argon plasma at       2 liters/minute and 20 watts was successful. Impression:               - Non-bleeding internal hemorrhoids.                           - Diverticulosis in the sigmoid colon.                           - Blood in the recto-sigmoid colon.                           - Localized moderate inflammation was found in the                            rectum. Treated with argon plasma coagulation (APC).                           - No specimens collected. Moderate Sedation:      Per Anesthesia Care Recommendation:           - Return patient to hospital ward for ongoing care.                           - Resume regular diet.                           - Consider RFA at [facility] for definitive treatment.                           - Keep BMs soft, Miralax 1-2 times daily. Procedure Code(s):        --- Professional ---                           [phone], Sigmoidoscopy, flexible; with control of                            bleeding, any method Diagnosis Code(s):        --- Professional ---                           K64.8, Other hemorrhoids                           K92.2, Gastrointestinal hemorrhage, unspecified                           K62.89, Other specified diseases of anus and rectum                           K92.1, Melena (includes Hematochezia)  K57.30, Diverticulosis of large intestine without                            perforation or abscess without bleeding CPT copyright 2022 [employer] Association. All rights reserved. The codes documented in this report are preliminary and upon coder review may  be revised to meet current compliance requirements. Hennie Duos. Marletta Lor, DO Hennie Duos. Marletta Lor, DO 03/14/2023 10:26:42 AM This report has been signed electronically. Number of Addenda: 0

## 2023-03-14 NOTE — Discharge Summary (Signed)
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 The patient was admitted to the [facility] unit from 01/12/2023 to 01/28/2023.  The patient did have episodes of hematochezia during his inpatient stay.  His hemoglobin did drop to less than 7.  GI was consulted.  He underwent colonoscopy on 01/15/2023.  Colonoscopy revealed diverticula in the ascending colon, descending colon, and sigmoid colon without bleeding.  Rectal mucosa had diffuse active oozing of blood with erythema and friability.  He was treated with APC.  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  His hemoglobin was 8.7 on 01/26/2023 at the time of his discharge.  Since his discharge, the patient has noted an increased frequency of his hematochezia.  He denies any fevers, chills, chest pain, shortness of breath, nausea, vomiting, abdominal pain.  He does have some dyspnea on exertion.  He had routine follow-up with his PCP and blood work was obtained.  His hemoglobin was noted to be 5.6, and the patient was called to go to the emergency department for further evaluation and treatment.  In the ED, the patient was afebrile and hemodynamically stable with oxygen saturation 100% on room air.  WBC 3.9, hemoglobin 5.3, platelets 1 55,000.  Sodium 134, potassium 4.0, bicarb 19, serum creatinine 2.17.  LFTs were unremarkable.  Iron saturation 3%, ferritin 8.  The patient was transfused 2 units PRBC.  GI was consulted to assist with  management.   Assessment and Plan: Hematochezia/acute blood loss anemia -due to proctitis; last radiation >3 yr ago -GI consulted -Presented with hemoglobin 5.3 -transfused PRBC--2 units -Previously on Lovenox--this has been on hold secondary to his GI bleeding -03/14/23 EGD--3 actively bleeding lesions treated with APC; proctitis -03/14/23--discussed with GI, Dr. Scherrie November to d/c home; use mirlax qday to bid; follow up with Dr. Margaretha Glassing at [facility] for RFA to rectum -Hgb 7.9 on day of d/c   Essential hypertension -Continue amlodipine   Paroxysmal atrial fibrillation -This occurred in the postop setting 6 years ago -Patient only had 1 episode in the past -Currently off anticoagulation secondary to GI bleed since Jan 2024   CKD stage IIIb -Wide variations in his serum creatinine -Appears that his baseline is 1.7-2.1 -Monitor serial BMP -serum creatinine 2.12 on day of d/c   Iron deficiency anemia -Secondary to chronic blood loss -Iron saturation 3%, ferritin 8 -start ferrous sulfate   Opioid dependence/chronic pain syndrome -Patient uses oxycodone sparingly -PDMP reviewed--oxycodone IR 5 mg, #42, last refill 02/16/23   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 -Try melantonin first   History of DVT/PE -As discussed above, patient is currently off anticoagulation secondary to GI bleed   Anxiety and depression -Continue sertraline   History of prostate cancer Patient was no longer having radiotherapy--last done 4 years ago He [facility]   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        Medication List     STOP taking these medications    solifenacin 10 MG tablet Commonly known as: VESICARE       TAKE these medications    acetaminophen 325 MG tablet Commonly known as: TYLENOL Take 2 tablets (650 mg total) by mouth every 6 (six) hours as needed for mild pain, headache or fever (or Fever >/= 101).   amLODipine 5 MG tablet Commonly known as: NORVASC Take 1 tablet (5 mg total) by mouth daily. For BP   omeprazole 20 MG capsule Commonly known as: PRILOSEC Take 1 capsule (20 mg total) by mouth daily.   oxyCODONE 5 MG immediate release tablet Commonly known as: Oxy IR/ROXICODONE Take 5 mg by mouth daily.   polyethylene glycol 17 g packet Commonly known as: MIRALAX / GLYCOLAX Take 17 g by mouth 2 (two) times daily.   sertraline 100 MG tablet Commonly known as: ZOLOFT Take 100 mg by mouth daily.        Discharge Exam: Filed Weights   03/13/23 0002 03/13/23 0247  Weight: 62.6 kg 66.4 kg   HEENT:  /AT, No thrush, no icterus CV:  RRR, no rub, no S3, no S4 Lung:  CTA, no wheeze, no rhonchi Abd:  soft/+BS, NT Ext:  No edema, no lymphangitis, no synovitis, no rash   Condition at discharge: stable  The results of significant diagnostics from this hospitalization (including imaging, microbiology, ancillary and laboratory) are listed below for reference.   Imaging Studies: No results found.  Microbiology: Results for orders placed or performed during the hospital encounter of 07/21/21  Resp Panel by RT-PCR (Flu A&B, Covid) Nasopharyngeal Swab     Status: None   Collection Time: 07/22/21  3:22 AM   Specimen: Nasopharyngeal Swab; Nasopharyngeal(NP) swabs in vial transport medium  Result Value Ref Range Status   SARS Coronavirus 2 by RT PCR NEGATIVE NEGATIVE Final    Comment: (NOTE) SARS-CoV-2 target nucleic  acids are NOT DETECTED.  The SARS-CoV-2 RNA is generally detectable in upper respiratory specimens during the acute phase of infection. The lowest concentration of SARS-CoV-2 viral copies this assay can detect is 138 copies/mL. A negative result does not preclude SARS-Cov-2 infection and should not be used as the sole basis for treatment or other patient management decisions. A negative result may occur with  improper specimen collection/handling, submission of specimen other than nasopharyngeal swab, presence of viral mutation(s) within the areas targeted by this assay, and inadequate number of viral copies(<138 copies/mL). A negative result must be combined with clinical observations, patient history, and epidemiological information. The expected result is Negative.  Fact Sheet for Patients:  BloggerCourse.com  Fact Sheet for Healthcare Providers:  SeriousBroker.it  This test is no t yet approved or cleared by the [place] FDA and  has been authorized for detection and/or diagnosis of SARS-CoV-2 by FDA under an Emergency Use Authorization (EUA). This EUA will remain  in effect (meaning this test can be used) for the duration of the COVID-19 declaration under Section 564(b)(1) of the Act, 21 U.S.C.section 360bbb-3(b)(1), unless the authorization is terminated  or revoked sooner.  Influenza A by PCR NEGATIVE NEGATIVE Final   Influenza B by PCR NEGATIVE NEGATIVE Final    Comment: (NOTE) The Xpert Xpress SARS-CoV-2/FLU/RSV plus assay is intended as an aid in the diagnosis of influenza from Nasopharyngeal swab specimens and should not be used as a sole basis for treatment. Nasal washings and aspirates are unacceptable for Xpert Xpress SARS-CoV-2/FLU/RSV testing.  Fact Sheet for Patients: BloggerCourse.com  Fact Sheet for Healthcare Providers: SeriousBroker.it  This  test is not yet approved or cleared by the [place] FDA and has been authorized for detection and/or diagnosis of SARS-CoV-2 by FDA under an Emergency Use Authorization (EUA). This EUA will remain in effect (meaning this test can be used) for the duration of the COVID-19 declaration under Section 564(b)(1) of the Act, 21 U.S.C. section 360bbb-3(b)(1), unless the authorization is terminated or revoked.  Performed at [facility], [address] [place], [place] [address]     Labs: CBC: Recent Labs  Lab 03/13/23 0030 03/14/23 0413  WBC 3.9* 3.9*  NEUTROABS 2.2  --   HGB 5.3* 7.9*  HCT 17.7* 24.6*  MCV 77.6* 79.9*  PLT 155 139*   Basic Metabolic Panel: Recent Labs  Lab 03/13/23 0030 03/14/23 0413  NA 134* 134*  K 4.0 4.0  CL 108 109  CO2 19* 19*  GLUCOSE 126* 95  BUN 33* 28*  CREATININE 2.17* 2.12*  CALCIUM 9.2 9.2   Liver Function Tests: Recent Labs  Lab 03/13/23 0030  AST 24  ALT 13  ALKPHOS 113  BILITOT 0.4  PROT 7.2  ALBUMIN 3.0*   CBG: No results for input(s): "GLUCAP" in the last 168 hours.  Discharge time spent: greater than 30 minutes.  Signed: Catarina Hartshorn, MD [facility] 03/14/2023

## 2023-03-14 NOTE — Transfer of Care (Signed)
Immediate Anesthesia Transfer of Care Note  Patient: Carlos Rodriguez  Procedure(s) Performed: FLEXIBLE SIGMOIDOSCOPY  Patient Location: PACU  Anesthesia Type:General  Level of Consciousness: awake and alert   Airway & Oxygen Therapy: Patient Spontanous Breathing  Post-op Assessment: Report given to RN and Post -op Vital signs reviewed and stable  Post vital signs: Reviewed and stable  Last Vitals:  Vitals Value Taken Time  BP 91/63 03/14/23 1020  Temp 36.7 C 03/14/23 1018  Pulse 57 03/14/23 1021  Resp 15 03/14/23 1021  SpO2 98 % 03/14/23 1021  Vitals shown include unvalidated device data.  Last Pain:  Vitals:   03/14/23 1018  TempSrc:   PainSc: Asleep         Complications: No notable events documented.

## 2023-03-14 NOTE — Progress Notes (Signed)
Tap water enema done pt tolerated well small amounts of stool and small clots came out. Pt cleaned and ready for endo.

## 2023-03-14 NOTE — Anesthesia Postprocedure Evaluation (Signed)
Anesthesia Post Note  Patient: Carlos Rodriguez  Procedure(s) Performed: FLEXIBLE SIGMOIDOSCOPY  Patient location during evaluation: PACU Anesthesia Type: General Level of consciousness: awake and alert Pain management: pain level controlled Vital Signs Assessment: post-procedure vital signs reviewed and stable Respiratory status: spontaneous breathing, nonlabored ventilation, respiratory function stable and patient connected to nasal cannula oxygen Cardiovascular status: blood pressure returned to baseline and stable Postop Assessment: no apparent nausea or vomiting Anesthetic complications: no   No notable events documented.   Last Vitals:  Vitals:   03/14/23 0955 03/14/23 1018  BP: (!) 151/82   Pulse: (!) 55 (!) 57  Resp: 12 14  Temp: 36.9 C 36.7 C  SpO2: 100% 97%    Last Pain:  Vitals:   03/14/23 1018  TempSrc:   PainSc: Asleep                 Windell Norfolk

## 2023-03-14 NOTE — Care Management Obs Status (Signed)
MEDICARE OBSERVATION STATUS NOTIFICATION   Patient Details  Name: Manases Chesky MRN: [id number] Date of Birth: [birth year]   Medicare Observation Status Notification Given:  Yes    Meta Kroenke Marsh Dolly, LCSW 03/14/2023, 1:27 PM

## 2023-03-15 ENCOUNTER — Other Ambulatory Visit: Payer: Self-pay

## 2023-03-15 ENCOUNTER — Telehealth: Payer: Self-pay | Admitting: Internal Medicine

## 2023-03-15 DIAGNOSIS — K625 Hemorrhage of anus and rectum: Secondary | ICD-10-CM

## 2023-03-15 NOTE — Telephone Encounter (Signed)
Phoned the pt and advised of labs being done in one week. Pt requests [facility]. Labs placed and released, pt aware.

## 2023-03-15 NOTE — Telephone Encounter (Signed)
Please arrange hospital f/u visit with Dr. Levon Hedger or APP. Patient also needs repeat CBC in 1 week. thank you  Per Dr Marletta Lor

## 2023-03-17 ENCOUNTER — Encounter (HOSPITAL_COMMUNITY): Payer: Self-pay | Admitting: Internal Medicine

## 2023-03-23 LAB — CBC WITH DIFFERENTIAL/PLATELET
Basophils Absolute: 0 10*3/uL (ref 0.0–0.2)
Basos: 0 %
EOS (ABSOLUTE): 0.2 10*3/uL (ref 0.0–0.4)
Eos: 4 %
Hematocrit: 24.4 % — ABNORMAL LOW (ref 37.5–51.0)
Hemoglobin: 7.4 g/dL — ABNORMAL LOW (ref 13.0–17.7)
Immature Grans (Abs): 0 10*3/uL (ref 0.0–0.1)
Immature Granulocytes: 0 %
Lymphocytes Absolute: 0.9 10*3/uL (ref 0.7–3.1)
Lymphs: 20 %
MCH: 25.2 pg — ABNORMAL LOW (ref 26.6–33.0)
MCHC: 30.3 g/dL — ABNORMAL LOW (ref 31.5–35.7)
MCV: 83 fL (ref 79–97)
Monocytes Absolute: 0.5 10*3/uL (ref 0.1–0.9)
Monocytes: 11 %
Neutrophils Absolute: 2.9 10*3/uL (ref 1.4–7.0)
Neutrophils: 65 %
Platelets: 160 10*3/uL (ref 150–450)
RBC: 2.94 x10E6/uL — ABNORMAL LOW (ref 4.14–5.80)
RDW: 16.9 % — ABNORMAL HIGH (ref 11.6–15.4)
WBC: 4.5 10*3/uL (ref 3.4–10.8)

## 2023-03-29 ENCOUNTER — Ambulatory Visit (INDEPENDENT_AMBULATORY_CARE_PROVIDER_SITE_OTHER): Payer: Medicare Other | Admitting: Gastroenterology

## 2023-03-29 ENCOUNTER — Telehealth: Payer: Self-pay | Admitting: *Deleted

## 2023-03-29 ENCOUNTER — Encounter: Payer: Self-pay | Admitting: Gastroenterology

## 2023-03-29 VITALS — BP 117/75 | HR 76 | Temp 97.9°F | Ht 69.0 in | Wt 138.0 lb

## 2023-03-29 DIAGNOSIS — K625 Hemorrhage of anus and rectum: Secondary | ICD-10-CM | POA: Diagnosis not present

## 2023-03-29 DIAGNOSIS — D649 Anemia, unspecified: Secondary | ICD-10-CM | POA: Diagnosis not present

## 2023-03-29 DIAGNOSIS — K627 Radiation proctitis: Secondary | ICD-10-CM | POA: Diagnosis not present

## 2023-03-29 NOTE — Progress Notes (Addendum)
GI Office Note    Referring Provider: Zoila Shutter, MD Primary Care Physician:  Zoila Shutter, MD  Primary Gastroenterologist: Dolores Frame, MD   Chief Complaint   Chief Complaint  Patient presents with   New Patient (Initial Visit)    Follow up from ER   History of Present Illness   Carlos Rodriguez is a 77 y.o. male presenting today at the request of Woodyear, Levada Schilling, MD for hospital follow up. Patient has previously been followed by [facility] in 2023 and 2024 for evaluation of rectal bleeding and incomplete defecation.  Has history of external hemorrhoid excision by Dr. Rubye Oaks in December 2022.  Patient seen at [facility] in October 2022 secondary to rectal bleeding in the setting of heparin infusion for which he underwent colonoscopy as noted below.  Recommendation was for MiraLAX twice daily to have looser bowel movements to avoid ongoing colitis and bleeding.  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  Reportedly had recently been seen by a provider in May 2023 for rectal bleeding with clots and he reportedly was taking Benefiber daily and Senokot for constipation and chronically on Lovenox.  Rectal examination at that time revealed normal anatomy without fissure or external hemorrhoid.  Hemoglobin was stable at 10.4 with normocytic indices.  Colonoscopy by Dr. Grayling Congress at [facility] 01/15/2023: -Ascending, descending, and sigmoid diverticulosis -Abnormal rectal mucosa with diffuse active oozing and friability consistent with known radiation proctitis, treated with APC -Recommended retreatment using RFA (Dr. Margaretha Glassing) can be considered if bleeding persist after several  weeks.  Recently admitted to [facility] 03/12/23 for hematochezia.  Had recent hemoglobin check with PCP was 5.6.  He was given 2 units PRBC.  He did report history of chronic constipation and reported recent straining.  He noted some intermittent bleeding since his prior discharge in April but was progressively becoming more frequent and was always bright red.  Denied any rectal pain.  Did report some occasional abdominal pain.  He underwent flexible sigmoidoscopy as noted below.  Hemoglobin on discharge was 7.9  Flexible sigmoidoscopy 03/12/2023: -Nonbleeding internal hemorrhoids -Sigmoid diverticulosis -Blood in rectosigmoid colon -Localized moderate inflammation found in the rectum treated with APC -Consider RFA [facility] for definitive treatment, with MiraLAX 1-2 times daily  Labs 03/22/23: Hgb 7.4  Today: Wife Carlos Rodriguez is present and helps provide/clarify some HPI.   Rectal bleeding - has calmed down quite a bit and has been trying to change his diet. Has not been on any anticoagulation since January. Once in a while he is having some rectal bleeding but not with every BM. Denies dizziness or falls. Denies SOB and chest pain.   Bowel habits - Taking miralax only a couple times per week. Sometimes he pushes a little without overt straining. Tries to stay away from sweets. Wife Carlos Rodriguez states his portioning is smaller than normal but depending on what is made at home he eats good. Eats cooked veggies and tries to stick to fiber foods.   He gets sick when he takes the prep for colonoscopy and had to get an NGT for the last one.   Current Outpatient Medications  Medication Sig Dispense Refill   acetaminophen (TYLENOL) 325 MG tablet Take 2 tablets (650 mg total) by mouth  every 6 (six) hours as needed for mild pain, headache or fever (or Fever >/= 101). 12 tablet 0   amLODipine (NORVASC) 5 MG tablet Take 1 tablet (5 mg total) by mouth daily. For BP 30 tablet 1   omeprazole (PRILOSEC) 20 MG capsule Take 1  capsule (20 mg total) by mouth daily. 30 capsule 2   polyethylene glycol (MIRALAX / GLYCOLAX) 17 g packet Take 17 g by mouth 2 (two) times daily. 60 each 2   sertraline (ZOLOFT) 100 MG tablet Take 100 mg by mouth daily.     ferrous sulfate 325 (65 FE) MG tablet Take 1 tablet (325 mg total) by mouth daily with breakfast. (Patient not taking: Reported on 03/29/2023)  3   oxyCODONE (OXY IR/ROXICODONE) 5 MG immediate release tablet Take 5 mg by mouth daily. (Patient not taking: Reported on 03/29/2023)     No current facility-administered medications for this visit.    Past Medical History:  Diagnosis Date   Asthma    CKD (chronic kidney disease)    HTN (hypertension)    Personal history of DVT (deep vein thrombosis)    and PE   Prostate cancer (HCC)    Sarcoidosis    Stiffman syndrome     Past Surgical History:  Procedure Laterality Date   CARDIAC SURGERY     COLONOSCOPY WITH PROPOFOL N/A 07/24/2021   Procedure: COLONOSCOPY WITH PROPOFOL;  Surgeon: Lanelle Bal, DO;  Location: [facility];  Service: Endoscopy;  Laterality: N/A;   FLEXIBLE SIGMOIDOSCOPY N/A 03/14/2023   Procedure: FLEXIBLE SIGMOIDOSCOPY;  Surgeon: Lanelle Bal, DO;  Location: [facility];  Service: Endoscopy;  Laterality: N/A;   POLYPECTOMY  07/24/2021   Procedure: POLYPECTOMY;  Surgeon: Lanelle Bal, DO;  Location: [facility];  Service: Endoscopy;;   right rotator cuff     ROBOT ASSISTED LAPAROSCOPIC RADICAL PROSTATECTOMY     SHOULDER SURGERY     Right shoulder   TUMOR REMOVAL      Family History  Problem Relation Age of Onset   Colon cancer Neg Hx    Colon polyps Neg Hx     Allergies as of 03/29/2023 - Review Complete 03/29/2023  Allergen Reaction Noted   Dilaudid [hydromorphone] Shortness Of Breath 07/21/2021   Lyrica [pregabalin] Other (See Comments) 03/13/2023    Social History   Socioeconomic History   Marital status: Married    Spouse name: Not on file   Number of children:  Not on file   Years of education: Not on file   Highest education level: Not on file  Occupational History   Not on file  Tobacco Use   Smoking status: Never   Smokeless tobacco: Never  Vaping Use   Vaping Use: Never used  Substance and Sexual Activity   Alcohol use: Not Currently    Comment: occasionaly   Drug use: Not Currently   Sexual activity: Yes  Other Topics Concern   Not on file  Social History Narrative   Not on file   Social Determinants of Health   Financial Resource Strain: Not on file  Food Insecurity: No Food Insecurity (03/13/2023)   Hunger Vital Sign    Worried About Running Out of Food in the Last Year: Never true    Ran Out of Food in the Last Year: Never true  Transportation Needs: No Transportation Needs (03/13/2023)   PRAPARE - Administrator, Civil Service (Medical): No    Lack of Transportation (Non-Medical):  No  Physical Activity: Not on file  Stress: Not on file  Social Connections: Not on file  Intimate Partner Violence: Not At Risk (03/13/2023)   Humiliation, Afraid, Rape, and Kick questionnaire    Fear of Current or Ex-Partner: No    Emotionally Abused: No    Physically Abused: No    Sexually Abused: No   Review of Systems   Gen: Denies any fever, chills, fatigue, weight loss, lack of appetite.  CV: Denies chest pain, heart palpitations, peripheral edema, syncope.  Resp: Denies shortness of breath at rest or with exertion. Denies wheezing or cough.  GI: see HPI GU : Denies urinary burning, urinary frequency, urinary hesitancy MS: Denies joint pain, muscle weakness, cramps, or limitation of movement.  Derm: Denies rash, itching, dry skin Psych: Denies depression, anxiety, memory loss, and confusion Heme: Denies bruising, bleeding, and enlarged lymph nodes.  Physical Exam   BP 117/75   Pulse 76   Temp 97.9 F (36.6 C)   Ht 5\' 9"  (1.753 m)   Wt 138 lb (62.6 kg)   BMI 20.38 kg/m   General:   Alert and oriented. Pleasant and  cooperative. Well-nourished and well-developed.  Head:  Normocephalic and atraumatic. Eyes:  Without icterus, sclera clear and conjunctiva pink.  Ears:  Normal auditory acuity. Lungs:  Clear to auscultation bilaterally. No wheezes, rales, or rhonchi. No distress.  Heart:  S1, S2 present without murmurs appreciated.  Abdomen: Limited given patient wheelchair.  +BS, soft, non-tender and non-distended. No HSM noted. No guarding or rebound. No masses appreciated.  Rectal:  Deferred  Msk: Right AKA.  In wheelchair today for visit. Extremities:  Without edema to LLE. RLE non existent.  Neurologic:  Alert and  oriented x4;  grossly normal neurologically. Skin:  Intact without significant lesions or rashes. Psych:  Alert and cooperative. Normal mood and affect.  Assessment   Carlos Rodriguez is a 77 y.o. 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  Acute on chronic anemia, rectal bleeding, constipation: Likely in the setting of hematochezia secondary to radiation proctitis.  Has underwent multiple colonoscopies and flexible sigmoidoscopies in the past and had local treatment of rectal mucosal friability with APC therapy.  Previously following with [facility] and has had recommendation to pursue radiofrequency ablation of mucosa if ongoing rectal bleeding occurs.  They are set up for follow-up September for consideration of RFA.  His recent flexible sigmoidoscopies treatment of APC therapy for rectal mucosal friability and rectal bleeding.  Hemoglobin as low as 5.6 on admission and was given 2 units of PRBCs with improvement to 7.9 and was 7.4 on  discharge on 6/10.  Denies any dizziness, lightheadedness, or syncope.  Has had significant improvement in rectal bleeding since intervention but still occurs intermittently and he has been working on changing his diet.  Has not been consistent with taking MiraLAX to soften stools and does occasionally have to push/strain although he denies any significant straining.  Has not yet started iron supplementation but they will go today to pick some up to start taking.  I reinforced the patient that iron supplementation can worsen constipation so it is very important for him to begin  taking his MiraLAX daily whether it be in the morning or at night, whichever his preferences.  He also needs to ensure he stays hydrated.  Will be rechecking his CBC and iron panel and continue to check as needed, likely will need frequent checks.  Can plan to repeat flexible sigmoidoscopy if significant rectal bleeding continues again while awaiting treatment with RFA.  PLAN   CBC, iron panel. Will likely need frequent CBCs.  Follow up with [facility] for RFA - scheduled for September, will attempt to get sooner appointment Miralax 17g once daily, be consistent with it everyday.  Continue omeprazole once daily.  Start iron supplement daily.  Follow up in 3 months.    Brooke Bonito, MSN, FNP-BC, AGACNP-BC [facility]  I have reviewed the note and agree with the APP's assessment as described in this progress note  Katrinka Blazing, MD Gastroenterology and Hepatology [facility]

## 2023-03-29 NOTE — Telephone Encounter (Signed)
Called [facility] and spoke with scheduler. She says she will sent a message to see if can get pt in sooner then September for RFA of radiation proctitis. She will call back once she has an answer.

## 2023-03-29 NOTE — Patient Instructions (Addendum)
Please complete CBC and iron panel this Friday or the following Monday for recheck of your hemoglobin.  Start once daily iron supplementation.  Please take MiraLAX 17 g once daily (either nightly or in the morning), which ever is your preference.  This will be important as when she started iron therapy this can contribute to worsening constipation.  Try to avoid straining.  You may want to reach out to [facility] to see if they can get you a sooner appointment than September or at least added to the cancellation list.  I will be in touch with lab results once I have received them and had a chance chance to review them.  I will determine frequency of ongoing lab draws pending the results.  I will plan to see you in the office in 3 months, sooner if needed.   It was a pleasure to see you today. I want to create trusting relationships with patients. If you receive a survey regarding your visit,  I greatly appreciate you taking time to fill this out on paper or through your MyChart. I value your feedback.  Brooke Bonito, MSN, FNP-BC, AGACNP-BC [facility]

## 2023-03-30 NOTE — Telephone Encounter (Signed)
Beth from [facility] left voicemail about pt being scheduled  for RFA and wanting to see if they could get him sooner then 07/02/23. She said unfortunately they do not have any sooner appointments. She said pt is more then welcome to call and see if there has been any cancellations. FYI

## 2023-04-03 LAB — CBC
Hematocrit: 26.2 % — ABNORMAL LOW (ref 37.5–51.0)
Hemoglobin: 7.9 g/dL — ABNORMAL LOW (ref 13.0–17.7)
MCH: 24.4 pg — ABNORMAL LOW (ref 26.6–33.0)
MCHC: 30.2 g/dL — ABNORMAL LOW (ref 31.5–35.7)
MCV: 81 fL (ref 79–97)
Platelets: 206 10*3/uL (ref 150–450)
RBC: 3.24 x10E6/uL — ABNORMAL LOW (ref 4.14–5.80)
RDW: 17.8 % — ABNORMAL HIGH (ref 11.6–15.4)
WBC: 4.1 10*3/uL (ref 3.4–10.8)

## 2023-04-03 LAB — IRON,TIBC AND FERRITIN PANEL
Ferritin: 60 ng/mL (ref 30–400)
Iron Saturation: 28 % (ref 15–55)
Iron: 104 ug/dL (ref 38–169)
Total Iron Binding Capacity: 376 ug/dL (ref 250–450)
UIBC: 272 ug/dL (ref 111–343)

## 2023-04-07 ENCOUNTER — Other Ambulatory Visit: Payer: Self-pay | Admitting: *Deleted

## 2023-04-07 DIAGNOSIS — K625 Hemorrhage of anus and rectum: Secondary | ICD-10-CM

## 2023-04-07 DIAGNOSIS — D649 Anemia, unspecified: Secondary | ICD-10-CM

## 2023-09-30 ENCOUNTER — Emergency Department (HOSPITAL_COMMUNITY): Payer: Medicare Other

## 2023-09-30 ENCOUNTER — Emergency Department (HOSPITAL_COMMUNITY)
Admission: EM | Admit: 2023-09-30 | Discharge: 2023-09-30 | Disposition: A | Discharge status: Home / Self Care | Payer: Medicare Other | Attending: Emergency Medicine | Admitting: Emergency Medicine

## 2023-09-30 ENCOUNTER — Other Ambulatory Visit: Payer: Self-pay

## 2023-09-30 DIAGNOSIS — Z96611 Presence of right artificial shoulder joint: Secondary | ICD-10-CM | POA: Insufficient documentation

## 2023-09-30 DIAGNOSIS — S43004A Unspecified dislocation of right shoulder joint, initial encounter: Secondary | ICD-10-CM | POA: Insufficient documentation

## 2023-09-30 DIAGNOSIS — X501XXA Overexertion from prolonged static or awkward postures, initial encounter: Secondary | ICD-10-CM | POA: Diagnosis not present

## 2023-09-30 DIAGNOSIS — S4991XA Unspecified injury of right shoulder and upper arm, initial encounter: Secondary | ICD-10-CM | POA: Diagnosis present

## 2023-09-30 MED ORDER — PROPOFOL 10 MG/ML IV BOLUS
30.0000 mg | Freq: Once | INTRAVENOUS | Status: AC
  Administered 2023-09-30: 30 mg via INTRAVENOUS
  Filled 2023-09-30: qty 20

## 2023-09-30 MED ORDER — OXYCODONE-ACETAMINOPHEN 5-325 MG PO TABS: ORAL_TABLET | ORAL | 0 refills | Status: DC

## 2023-09-30 MED ORDER — MORPHINE SULFATE (PF) 4 MG/ML IV SOLN
6.0000 mg | Freq: Once | INTRAVENOUS | Status: AC
  Administered 2023-09-30: 6 mg via INTRAVENOUS
  Filled 2023-09-30: qty 2

## 2023-09-30 MED ORDER — MORPHINE SULFATE (PF) 4 MG/ML IV SOLN
4.0000 mg | Freq: Once | INTRAVENOUS | Status: AC
  Administered 2023-09-30: 4 mg via INTRAVENOUS
  Filled 2023-09-30: qty 1

## 2023-09-30 NOTE — ED Notes (Signed)
Pt states pain has improved.

## 2023-09-30 NOTE — ED Provider Notes (Signed)
Postreduction x-ray has finally been read by radiology and does show that it is reduced.  Patient stable for discharge.   Vanetta Mulders, MD 09/30/23 1721

## 2023-09-30 NOTE — Evaluation (Signed)
Panel 1 Procedure LRB Anes Op Region Wound Class Comments  TOTAL SHOULDER REVERSE ARTHROPLASTY Right Regional with General Shoulder Clean Brett Canales with [employer] notified of case 9/16[facility]    Surgery Details Surgeon Surgeon Role Service Panel  Case, [place] Miller, MD Primary Orthopedics 1

## 2023-09-30 NOTE — Discharge Instructions (Addendum)
Follow-up with your orthopedic doctor next week.  Wear your sling is much as possible.  Postreduction x-ray shows shoulder is relocated.

## 2023-09-30 NOTE — ED Triage Notes (Signed)
Pt states he woke up around 7 am with right shoulder pain. Pt states he had surgery on right shoulder 2 years ago. Reduced ROM noted.

## 2023-09-30 NOTE — ED Provider Notes (Signed)
[place] EMERGENCY DEPARTMENT AT [facility] Provider Note   CSN: [id number] Arrival date & time: 09/30/23  4696     History  Chief Complaint  Patient presents with   Shoulder Pain    Right shoulder pain    Carlos Rodriguez is a 77 y.o. male.  Patient has a history of a reverse shoulder replacement.  This occurred about 2 years ago.  Patient states he awoke this morning with pain in his shoulder.  The history is provided by the patient and medical records. No language interpreter was used.  Shoulder Pain Location:  Shoulder Shoulder location:  R shoulder Pain details:    Quality:  Aching   Radiates to:  R arm   Severity:  Moderate   Onset quality:  Sudden   Timing:  Constant   Progression:  Worsening Dislocation: Unknown.   Associated symptoms: no back pain and no fatigue        Home Medications Prior to Admission medications   Medication Sig Start Date End Date Taking? Authorizing Provider  acetaminophen (TYLENOL) 325 MG tablet Take 2 tablets (650 mg total) by mouth every 6 (six) hours as needed for mild pain, headache or fever (or Fever >/= 101). 07/25/21   Shon Hale, MD  amLODipine (NORVASC) 5 MG tablet Take 1 tablet (5 mg total) by mouth daily. For BP 07/26/21   Shon Hale, MD  ferrous sulfate 325 (65 FE) MG tablet Take 1 tablet (325 mg total) by mouth daily with breakfast. Patient not taking: Reported on 03/29/2023 03/15/23   Catarina Hartshorn, MD  omeprazole (PRILOSEC) 40 MG capsule Take 40 mg by mouth 2 (two) times daily.    [provider]  oxyCODONE (OXY IR/ROXICODONE) 5 MG immediate release tablet Take 5 mg by mouth daily. Patient not taking: Reported on 03/29/2023 07/11/21   [provider]  polyethylene glycol (MIRALAX / GLYCOLAX) 17 g packet Take 17 g by mouth 2 (two) times daily. 07/25/21   Shon Hale, MD  sertraline (ZOLOFT) 100 MG tablet Take 100 mg by mouth daily. 12/09/22   [provider]       Allergies    Hydromorphone and Pregabalin    Review of Systems   Review of Systems  Constitutional:  Negative for appetite change and fatigue.  HENT:  Negative for congestion, ear discharge and sinus pressure.   Eyes:  Negative for discharge.  Respiratory:  Negative for cough.   Cardiovascular:  Negative for chest pain.  Gastrointestinal:  Negative for abdominal pain and diarrhea.  Genitourinary:  Negative for frequency and hematuria.  Musculoskeletal:  Negative for back pain.       Right shoulder pain  Skin:  Negative for rash.  Neurological:  Negative for seizures and headaches.  Psychiatric/Behavioral:  Negative for hallucinations.     Physical Exam Updated Vital Signs BP 135/76 (BP Location: Left Arm)   Pulse (!) 51   Temp 98 F (36.7 C) (Oral)   Resp 16   Ht 5\' 9"  (1.753 m)   Wt 66.7 kg   SpO2 100%   BMI 21.71 kg/m  Physical Exam Vitals and nursing note reviewed.  Constitutional:      Appearance: He is well-developed.  HENT:     Head: Normocephalic.     Nose: Nose normal.  Eyes:     General: No scleral icterus.    Conjunctiva/sclera: Conjunctivae normal.  Neck:     Thyroid: No thyromegaly.  Cardiovascular:     Rate and Rhythm:  Normal rate and regular rhythm.     Heart sounds: No murmur heard.    No friction rub. No gallop.  Pulmonary:     Breath sounds: No stridor. No wheezing or rales.  Chest:     Chest wall: No tenderness.  Abdominal:     General: There is no distension.     Tenderness: There is no abdominal tenderness. There is no rebound.  Musculoskeletal:        General: Normal range of motion.     Cervical back: Neck supple.     Comments: Deformed tender right shoulder.  Normal pulses in his right arm  Lymphadenopathy:     Cervical: No cervical adenopathy.  Skin:    Findings: No erythema or rash.  Neurological:     Mental Status: He is alert and oriented to person, place, and time.     Motor: No abnormal muscle tone.     Coordination:  Coordination normal.  Psychiatric:        Behavior: Behavior normal.     ED Results / Procedures / Treatments   Labs (all labs ordered are listed, but only abnormal results are displayed) Labs Reviewed - No data to display  EKG None  Radiology DG Shoulder Right Result Date: 09/30/2023 CLINICAL DATA:  Right shoulder pain, decreased range of motion EXAM: RIGHT SHOULDER - 2+ VIEW COMPARISON:  07/10/2021 FINDINGS: Internal rotation, external rotation, transscapular views of the right shoulder are obtained. There is superior and anterior dislocation of the humeral component of the right shoulder arthroplasty. No evidence of acute fracture. Stable hypertrophic changes of the acromioclavicular joint. Soft tissues are unremarkable. The right chest is clear. IMPRESSION: 1. Superior and anterior dislocation of the humeral component of the right shoulder arthroplasty. No evidence of associated fracture. Electronically Signed   By: Sharlet Salina M.D.   On: 09/30/2023 11:12    Procedures .Reduction of dislocation  Date/Time: 10/01/2023 5:22 PM  Performed by: Bethann Berkshire, MD Authorized by: Bethann Berkshire, MD  Consent: Verbal consent obtained.  Sedation: Patient sedated: yes Sedatives: propofol  Comments: Patient was sedated with propofol.  Shoulder was reduced with manual traction.  Patient tolerated the procedure well       Medications Ordered in ED Medications  propofol (DIPRIVAN) 10 mg/mL bolus/IV push 30 mg (has no administration in time range)  morphine (PF) 4 MG/ML injection 6 mg (6 mg Intravenous Given 09/30/23 0857)  morphine (PF) 4 MG/ML injection 4 mg (4 mg Intravenous Given 09/30/23 1048)    ED Course/ Medical Decision Making/ A&P                                 Medical Decision Making Amount and/or Complexity of Data Reviewed Radiology: ordered.  Risk Prescription drug management.   Dislocated right shoulder.  Reduced without difficulty.  Patient will  follow-up with his orthopedic doctor and is put in a sling        Final Clinical Impression(s) / ED Diagnoses Final diagnoses:  None    Rx / DC Orders ED Discharge Orders     None         Bethann Berkshire, MD 10/01/23 1722

## 2023-11-18 ENCOUNTER — Telehealth: Payer: Self-pay | Admitting: Gastroenterology

## 2023-11-18 NOTE — Telephone Encounter (Signed)
 Spoke with Dr. Gwynneth with [facility] today.  She inquired about the need for a colonoscopy for this patient.  After review of her last office note, patient had presented with rectal bleeding and with evidence of ongoing anemia.  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  I advised her that we had started him on oral iron for his anemia with hemoglobin in the 7 range June of last year when he was last seen.  She states that she will get some orders going and they will reach out to the patient to discuss evaluation and potential procedure.  Charmaine Melia, MSN, APRN, FNP-BC, AGACNP-BC [facility]

## 2024-03-24 ENCOUNTER — Emergency Department (HOSPITAL_COMMUNITY)

## 2024-03-24 ENCOUNTER — Emergency Department (HOSPITAL_COMMUNITY)
Admission: EM | Admit: 2024-03-24 | Discharge: 2024-03-25 | Disposition: A | Discharge status: Home / Self Care | Attending: Emergency Medicine | Admitting: Emergency Medicine

## 2024-03-24 ENCOUNTER — Encounter (HOSPITAL_COMMUNITY): Payer: Self-pay | Admitting: *Deleted

## 2024-03-24 ENCOUNTER — Other Ambulatory Visit: Payer: Self-pay

## 2024-03-24 DIAGNOSIS — N189 Chronic kidney disease, unspecified: Secondary | ICD-10-CM | POA: Insufficient documentation

## 2024-03-24 DIAGNOSIS — Z79899 Other long term (current) drug therapy: Secondary | ICD-10-CM | POA: Diagnosis not present

## 2024-03-24 DIAGNOSIS — Z8546 Personal history of malignant neoplasm of prostate: Secondary | ICD-10-CM | POA: Insufficient documentation

## 2024-03-24 DIAGNOSIS — M86161 Other acute osteomyelitis, right tibia and fibula: Secondary | ICD-10-CM | POA: Insufficient documentation

## 2024-03-24 DIAGNOSIS — I129 Hypertensive chronic kidney disease with stage 1 through stage 4 chronic kidney disease, or unspecified chronic kidney disease: Secondary | ICD-10-CM | POA: Diagnosis not present

## 2024-03-24 DIAGNOSIS — M869 Osteomyelitis, unspecified: Secondary | ICD-10-CM

## 2024-03-24 DIAGNOSIS — M79604 Pain in right leg: Secondary | ICD-10-CM | POA: Diagnosis present

## 2024-03-24 DIAGNOSIS — J45909 Unspecified asthma, uncomplicated: Secondary | ICD-10-CM | POA: Insufficient documentation

## 2024-03-24 LAB — COMPREHENSIVE METABOLIC PANEL WITH GFR
ALT: 19 U/L (ref 0–44)
AST: 25 U/L (ref 15–41)
Albumin: 3.3 g/dL — ABNORMAL LOW (ref 3.5–5.0)
Alkaline Phosphatase: 112 U/L (ref 38–126)
Anion gap: 8 (ref 5–15)
BUN: 35 mg/dL — ABNORMAL HIGH (ref 8–23)
CO2: 20 mmol/L — ABNORMAL LOW (ref 22–32)
Calcium: 9.8 mg/dL (ref 8.9–10.3)
Chloride: 112 mmol/L — ABNORMAL HIGH (ref 98–111)
Creatinine, Ser: 2.75 mg/dL — ABNORMAL HIGH (ref 0.61–1.24)
GFR, Estimated: 23 mL/min — ABNORMAL LOW (ref >60)
Glucose, Bld: 110 mg/dL — ABNORMAL HIGH (ref 70–99)
Potassium: 4.1 mmol/L (ref 3.5–5.1)
Sodium: 140 mmol/L (ref 135–145)
Total Bilirubin: 0.3 mg/dL (ref 0.0–1.2)
Total Protein: 7.1 g/dL (ref 6.5–8.1)

## 2024-03-24 LAB — CBC WITH DIFFERENTIAL/PLATELET
Abs Immature Granulocytes: 0.01 10*3/uL (ref 0.00–0.07)
Basophils Absolute: 0 10*3/uL (ref 0.0–0.1)
Basophils Relative: 0 %
Eosinophils Absolute: 0.1 10*3/uL (ref 0.0–0.5)
Eosinophils Relative: 1 %
HCT: 32.7 % — ABNORMAL LOW (ref 39.0–52.0)
Hemoglobin: 10.2 g/dL — ABNORMAL LOW (ref 13.0–17.0)
Immature Granulocytes: 0 %
Lymphocytes Relative: 26 %
Lymphs Abs: 1.1 10*3/uL (ref 0.7–4.0)
MCH: 27.5 pg (ref 26.0–34.0)
MCHC: 31.2 g/dL (ref 30.0–36.0)
MCV: 88.1 fL (ref 80.0–100.0)
Monocytes Absolute: 0.5 10*3/uL (ref 0.1–1.0)
Monocytes Relative: 12 %
Neutro Abs: 2.5 10*3/uL (ref 1.7–7.7)
Neutrophils Relative %: 61 %
Platelets: 131 10*3/uL — ABNORMAL LOW (ref 150–400)
RBC: 3.71 MIL/uL — ABNORMAL LOW (ref 4.22–5.81)
RDW: 18.3 % — ABNORMAL HIGH (ref 11.5–15.5)
WBC: 4.2 10*3/uL (ref 4.0–10.5)
nRBC: 0 % (ref 0.0–0.2)

## 2024-03-24 NOTE — ED Provider Notes (Signed)
 [place] EMERGENCY DEPARTMENT AT [facility] Provider Note   CSN: [id number] Arrival date & time: 03/24/24  1510     Patient presents with: Leg Pain   Carlos Rodriguez is a 78 y.o. 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  Patient presents to ED for evaluation of pain to right stump.  States that for the last 2 days he has had increasing pain to his high right AKA.  Denies fevers, nausea or vomiting at home.  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  States that he receives all of his care at [facility].  Reports that he went to PCP today and was redirected to ED for concern of infection.    Leg Pain Associated symptoms: no fever        Prior to Admission medications   Medication Sig Start Date End Date Taking? Authorizing Provider  acetaminophen  (TYLENOL ) 325 MG tablet Take 2 tablets (650 mg total) by mouth every 6 (six) hours as needed for mild pain, headache or fever (or Fever >/= 101). 07/25/21   Colin Dawley, MD  amLODipine  (NORVASC ) 5 MG tablet Take 1 tablet (5 mg total) by mouth daily. For BP 07/26/21   Colin Dawley, MD  ferrous sulfate  325 (65 FE) MG tablet Take 1 tablet (325 mg total) by mouth daily with breakfast. Patient not taking: Reported on 03/29/2023 03/15/23   Demaris Fillers, MD  omeprazole  (PRILOSEC) 40 MG capsule Take 40 mg by mouth 2 (two) times daily.    [provider]  oxyCODONE  (OXY IR/ROXICODONE ) 5 MG immediate release tablet Take 5 mg by mouth daily. Patient not taking: Reported on 03/29/2023 07/11/21   [provider]  oxyCODONE -acetaminophen  (PERCOCET/ROXICET) 5-325 MG tablet Use 1 every 6 hours for pain not relieved by Tylenol  or Motrin alone 09/30/23   Zammit, Joseph, MD  polyethylene glycol (MIRALAX  / GLYCOLAX ) 17 g packet Take 17 g by mouth 2 (two) times daily. 07/25/21    Colin Dawley, MD  sertraline  (ZOLOFT ) 100 MG tablet Take 100 mg by mouth daily. 12/09/22   [provider]    Allergies: Hydromorphone and Pregabalin    Review of Systems  Constitutional:  Negative for fever.  Gastrointestinal:  Negative for nausea and vomiting.  Musculoskeletal:  Positive for myalgias.  All other systems reviewed and are negative.   Updated Vital Signs BP (!) 144/72   Pulse (!) 123   Temp 98 F (36.7 C) (Oral)   Resp 18   Ht 5' 9 (1.753 m)   Wt 66.7 kg   SpO2 100%   BMI 21.72 kg/m   Physical Exam Vitals and nursing note reviewed.  Constitutional:      General: He is not in acute distress.    Appearance: He is well-developed.  HENT:     Head: Normocephalic and atraumatic.   Eyes:     Conjunctiva/sclera: Conjunctivae normal.    Cardiovascular:     Rate and Rhythm: Normal rate and regular rhythm.     Heart sounds: No murmur heard. Pulmonary:     Effort: Pulmonary effort is normal. No respiratory distress.     Breath sounds: Normal breath sounds.  Abdominal:     Palpations: Abdomen is soft.     Tenderness: There is no abdominal tenderness.   Musculoskeletal:        General: No swelling.     Cervical back: Neck supple.  Comments: No obvious erythema, ecchymosis, fluctuance to patient right stump.  There is extreme tenderness to palpation of lateral portion of stump.  No drainage.     Amputation Right Lower Extremity: Right leg is amputated above knee.   Skin:    General: Skin is warm and dry.     Capillary Refill: Capillary refill takes less than 2 seconds.   Neurological:     Mental Status: He is alert and oriented to person, place, and time. Mental status is at baseline.     GCS: GCS eye subscore is 4. GCS verbal subscore is 5. GCS motor subscore is 6.     Cranial Nerves: Cranial nerves 2-12 are intact. No cranial nerve deficit.     Sensory: Sensation is intact. No sensory deficit.     Motor: Motor function is intact. No  weakness.     Comments: Moves all extremities incoordinated fashion.  CN III through XII intact.  Intact finger-to-nose, elevation.  5 out of 5 strength bilateral upper extremities.  5 out of 5 strength to left lower extremity.  PERRL.  Tracks across midline.  Psychiatric:        Mood and Affect: Mood normal.     (all labs ordered are listed, but only abnormal results are displayed) Labs Reviewed  CBC WITH DIFFERENTIAL/PLATELET - Abnormal; Notable for the following components:      Result Value   RBC 3.71 (*)    Hemoglobin 10.2 (*)    HCT 32.7 (*)    RDW 18.3 (*)    Platelets 131 (*)    All other components within normal limits  COMPREHENSIVE METABOLIC PANEL WITH GFR - Abnormal; Notable for the following components:   Chloride 112 (*)    CO2 20 (*)    Glucose, Bld 110 (*)    BUN 35 (*)    Creatinine, Ser 2.75 (*)    Albumin 3.3 (*)    GFR, Estimated 23 (*)    All other components within normal limits  SEDIMENTATION RATE - Abnormal; Notable for the following components:   Sed Rate 39 (*)    All other components within normal limits  CULTURE, BLOOD (ROUTINE X 2)  CULTURE, BLOOD (ROUTINE X 2)  C-REACTIVE PROTEIN    EKG: None  Radiology: CT Hip Right Wo Contrast Result Date: 03/24/2024 CLINICAL DATA:  Soft tissue infection suspected, hip. X-ray done. Leg pain. EXAM: CT OF THE RIGHT HIP WITHOUT CONTRAST TECHNIQUE: Multidetector CT imaging of the right hip was performed according to the standard protocol. Multiplanar CT image reconstructions were also generated. RADIATION DOSE REDUCTION: This exam was performed according to the departmental dose-optimization program which includes automated exposure control, adjustment of the mA and/or kV according to patient size and/or use of iterative reconstruction technique. COMPARISON:  Pelvis and right hip radiographs 03/24/2024 and pelvis and left hip radiographs 07/21/2021; CT chest, abdomen, and pelvis 12/12/2021 FINDINGS:  Bones/Joint/Cartilage There is diffuse decreased bone mineralization. Postsurgical changes are seen of amputation of the right femur to the proximal femoral diaphysis. There is mild linear intermediate T2 signal stranding within the subcutaneous fat of the distal anterior aspect of the postsurgical proximal right thigh (sagittal series 6 images 43 through 55, axial series 9 images 113 through 121, coronal series 5 images 80 through 106). This appears to represent inflammatory stranding/phlegmon. There appears to be trace coalescing fluid measuring only 10 mm in thickness at the deep aspect of the distal anterior postsurgical proximal thigh subcutaneous fat (coronal series 5, image  92). This is approximately 2.8 cm distal to the proximal femoral bony amputation stump. No direct sinus tract is seen to the amputation stump. There is mild concavity of the bony amputation stump with the marrow receding approximately 8 mm compared to the surrounding peripheral bone cortices (sagittal series 8, image 50), is difficult to exclude osteomyelitis in this region. No comparison imaging is available. Ligaments Suboptimally assessed by CT. Muscles and Tendons Moderate atrophy and fatty infiltration of the right gluteus minimus muscle. No tendon tear is seen. Soft tissues Incidental note of a partially visualized penile prosthesis. Moderate atherosclerotic calcifications. Partial lumbarization of S1. Grade 1 anterolisthesis of L5 on S1. Mild retrolisthesis of L4 on L5. Severe L4-5 disc space narrowing with endplate sclerosis, vacuum phenomenon, and peripheral osteophytosis. Moderate to severe L5-S1 degenerative disc and endplate changes. IMPRESSION: 1. Postsurgical changes of amputation of the right femur to the proximal femoral diaphysis. 2. There is mild linear intermediate T2 signal stranding within the subcutaneous fat of the distal anterior aspect of the postsurgical proximal right thigh. This appears to represent inflammatory  stranding/phlegmon. Trace coalescing fluid measuring only 10 mm in thickness at the deep aspect of the distal anterior postsurgical proximal thigh subcutaneous fat approximately 2.8 cm distal to the proximal femoral stump. No direct sinus tract is seen to the amputation stump, however there is mild concavity of the bone marrow marrow receding approximately 8 mm compared to the surrounding peripheral bone cortices. It is difficult to exclude early osteomyelitis in this region. No comparison imaging is available to assess for interval change. Electronically Signed   By: Bertina Broccoli M.D.   On: 03/24/2024 17:48   DG Hip Unilat  With Pelvis 2-3 Views Right Result Date: 03/24/2024 CLINICAL DATA:  Right.  Pain for 2 days. EXAM: DG HIP (WITH OR WITHOUT PELVIS) 2-3V RIGHT COMPARISON:  July 21, 2021. FINDINGS: S/p amputation of right lower extremity at subtrochanteric level. No acute fracture or dislocation is noted. No definite lytic destruction is noted IMPRESSION: Status post right lower extremity amputation as noted above. No definite acute abnormality seen. Electronically Signed   By: Rosalene Colon M.D.   On: 03/24/2024 16:19    Procedures   Medications Ordered in the ED  vancomycin (VANCOREADY) IVPB 1500 mg/300 mL (1,500 mg Intravenous New Bag/Given 03/25/24 0333)  ceFEPIme (MAXIPIME) 2 g in sodium chloride  0.9 % 100 mL IVPB (0 g Intravenous Stopped 03/25/24 0208)  LORazepam (ATIVAN) injection 2 mg (2 mg Intravenous Given 03/25/24 0107)  LORazepam (ATIVAN) tablet 1 mg (1 mg Oral Given 03/25/24 0209)  sodium chloride  0.9 % bolus 500 mL (500 mLs Intravenous New Bag/Given 03/25/24 0330)    Clinical Course as of 03/25/24 0410  Fri Mar 24, 2024  2143 CT:1. Postsurgical changes of amputation of the right femur to the proximal femoral diaphysis. 2. There is mild linear intermediate T2 signal stranding within the subcutaneous fat of the distal anterior aspect of the postsurgical proximal right thigh.  This appears to represent inflammatory stranding/phlegmon. Trace coalescing fluid measuring only 10 mm in thickness at the deep aspect of the distal anterior postsurgical proximal thigh subcutaneous fat approximately 2.8 cm distal to the proximal femoral stump. No direct sinus tract is seen to the amputation stump, however there is mild concavity of the bone marrow marrow receding approximately 8 mm compared to the surrounding peripheral bone cortices. It is difficult to exclude early osteomyelitis in this region. No comparison imaging is available to assess for interval change.   [  TS]  Sat Mar 25, 2024  1610 Alda Hummer, MD, ortho surg [CG]  [phone] Dr. Evon Hoguet [CG]    Clinical Course User Index [CG] Adel Aden, PA-C [TS] Angele Keller, DO   Medical Decision Making Amount and/or Complexity of Data Reviewed Labs: ordered. Radiology: ordered.  Risk Prescription drug management.   78 year old male presents for evaluation.  Please see HPI for further details.  On examination patient is afebrile, nontachycardic.  His lung sounds are clear bilaterally, he is not hypoxic.  Abdomen is soft and compressible.  Neurological examination at baseline without focal neurodeficits.  Right stump without evidence of infection.  No drainage, erythema.  There is extreme tenderness to the lateral portion of his right stump.  Will assess with CBC, CMP, sedimentation rate, CRP, blood culture x 2.  In triage the patient had x-ray imaging of right stump, CT scan of right stump collected.  CT scan of right stump reveals potential phlegmon.  Also trace coalescing fluid measuring only 10 mm thickness at the deep aspect of the distal anterior postsurgical proximal thigh subcutaneous fat approximately 2.8 cm distal to the proximal femoral stump.  Difficult to exclude early osteomyelitis.  Will add on MRI of right hip.  CBC without leukocytosis, hemoglobin 10.2.  Metabolic panel with  baseline creatinine 2.75.  BUN 35.  No transaminitis.  Anion gap 8.  GFR 23.  Segmentation rate 39.  CRP 0.9.  MRI right hip pending at this time.  Patient given broad-spectrum antibiotics to include 2 g cefepime, 1500 mg vancomycin.  Patient did require 2 mg Ativan for MRI.  Apparently patient was unable to complete entire MRI.  Spoke with Dr. Evon Hoguet of [facility] orthopedic surgery.  He is stated that patient be transferred when bed is available.  Patient was placed on wait list at this time.  Patient amenable to plan.    Final diagnoses:  Osteomyelitis, unspecified site, unspecified type [facility])    ED Discharge Orders     None          Truly Stankiewicz F, PA-C 03/25/24 0417    Eve Hinders, MD 03/25/24 [phone]

## 2024-03-24 NOTE — ED Triage Notes (Signed)
 The pt has pain in his rt stump for 2 days getting worse  no temp he was sent here from his doctors office

## 2024-03-24 NOTE — ED Provider Triage Note (Signed)
 Emergency Medicine Provider Triage Evaluation Note  Carlos Rodriguez , a 78 y.o. male  was evaluated in triage.  Pt complains of hip pain.  Patient has history of right lower extremity amputation due to osteosarcoma, March of last year.  He reports history of phantom pains but for the past several days has been having worse pain.  Seen his PCP and advised to come here for further evaluation, to rule out infection.  Review of Systems  Positive: Right hip pain Negative: Fevers  Physical Exam  BP 122/76 (BP Location: Left Arm)   Pulse (!) 59   Temp 98.1 F (36.7 C)   Resp 18   Ht 5' 9 (1.753 m)   Wt 66.7 kg   SpO2 98%   BMI 21.72 kg/m  Gen:   Awake, no distress   Resp:  Normal effort  MSK:   Moves extremities without difficulty, no TTP Other:    Medical Decision Making  Medically screening exam initiated at 4:31 PM.  Appropriate orders placed.  Alisha Burgo was informed that the remainder of the evaluation will be completed by another provider, this initial triage assessment does not replace that evaluation, and the importance of remaining in the ED until their evaluation is complete.   Sonnie Dusky, PA-C 03/24/24 [phone]

## 2024-03-25 ENCOUNTER — Emergency Department (HOSPITAL_COMMUNITY)

## 2024-03-25 ENCOUNTER — Other Ambulatory Visit (HOSPITAL_COMMUNITY): Payer: Self-pay | Admitting: Internal Medicine

## 2024-03-25 DIAGNOSIS — M86161 Other acute osteomyelitis, right tibia and fibula: Secondary | ICD-10-CM | POA: Diagnosis not present

## 2024-03-25 LAB — SEDIMENTATION RATE: Sed Rate: 39 mm/h — ABNORMAL HIGH (ref 0–16)

## 2024-03-25 LAB — CBG MONITORING, ED: Glucose-Capillary: 76 mg/dL (ref 70–99)

## 2024-03-25 LAB — C-REACTIVE PROTEIN: CRP: 0.9 mg/dL (ref <1.0)

## 2024-03-25 MED ORDER — LORAZEPAM 2 MG/ML IJ SOLN
2.0000 mg | Freq: Once | INTRAMUSCULAR | Status: AC
  Administered 2024-03-25: 2 mg via INTRAVENOUS
  Filled 2024-03-25: qty 1

## 2024-03-25 MED ORDER — VANCOMYCIN HCL 1500 MG/300ML IV SOLN
1500.0000 mg | Freq: Once | INTRAVENOUS | Status: AC
  Administered 2024-03-25: 1500 mg via INTRAVENOUS
  Filled 2024-03-25: qty 300

## 2024-03-25 MED ORDER — LORAZEPAM 1 MG PO TABS
1.0000 mg | ORAL_TABLET | Freq: Once | ORAL | Status: AC
  Administered 2024-03-25: 1 mg via ORAL
  Filled 2024-03-25: qty 1

## 2024-03-25 MED ORDER — SODIUM CHLORIDE 0.9 % IV BOLUS
500.0000 mL | Freq: Once | INTRAVENOUS | Status: AC
  Administered 2024-03-25: 500 mL via INTRAVENOUS

## 2024-03-25 MED ORDER — SODIUM CHLORIDE 0.9 % IV SOLN
2.0000 g | Freq: Once | INTRAVENOUS | Status: AC
  Administered 2024-03-25: 2 g via INTRAVENOUS
  Filled 2024-03-25: qty 12.5

## 2024-03-25 NOTE — Progress Notes (Signed)
 ED Pharmacy Antibiotic Sign Off An antibiotic consult was received from an ED provider for Vancomycin and Cefepime  per pharmacy dosing for possible osteomyelitis. A chart review was completed to assess appropriateness.   The following one time order(s) were placed:  Vancomycin 1500 mg IV  Cefepime 2 g IV  Further antibiotic and/or antibiotic pharmacy consults should be ordered by the admitting provider if indicated.   Thank you for allowing pharmacy to be a part of this patient's care.   Carlota Chestnut, [facility]  Clinical Pharmacist 03/25/24 1:34 AM

## 2024-03-25 NOTE — ED Notes (Signed)
 Patient has bed ED to ED. Accepting doctor  [place] phone number to get report [phone]. [facility] will transport

## 2024-03-25 NOTE — ED Notes (Signed)
 Pt attempting to crawl out of bed and states going to make toast, with redirection pt is able to follow commands and A&Ox4

## 2024-03-25 NOTE — ED Notes (Signed)
 All documents printed and provided to [facility] team and verified. Report called to [facility] ED for ED to ED transfer.

## 2024-03-25 NOTE — ED Notes (Signed)
 Patient waiting  to be assigned a bed

## 2024-03-25 NOTE — ED Notes (Signed)
 Patient transported to MRI

## 2024-03-25 NOTE — ED Notes (Signed)
 [facility] unable to transport pt's wheel chair. Wheel chair labeled and placed in administrative assistant's office. Wallet found on wheel care and given to security officer to inventory and secure.

## 2024-03-29 LAB — CULTURE, BLOOD (ROUTINE X 2)
Culture: NO GROWTH
Culture: NO GROWTH
Special Requests: ADEQUATE
Special Requests: ADEQUATE

## 2024-09-06 ENCOUNTER — Encounter (HOSPITAL_COMMUNITY): Payer: Self-pay

## 2024-09-06 ENCOUNTER — Ambulatory Visit (INDEPENDENT_AMBULATORY_CARE_PROVIDER_SITE_OTHER): Payer: Self-pay | Admitting: Clinical

## 2024-09-06 DIAGNOSIS — F331 Major depressive disorder, recurrent, moderate: Secondary | ICD-10-CM

## 2024-09-06 DIAGNOSIS — F4310 Post-traumatic stress disorder, unspecified: Secondary | ICD-10-CM | POA: Diagnosis not present

## 2024-09-06 DIAGNOSIS — F419 Anxiety disorder, unspecified: Secondary | ICD-10-CM | POA: Diagnosis not present
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
## 2024-10-04 ENCOUNTER — Ambulatory Visit (HOSPITAL_COMMUNITY): Admitting: Clinical
